# Patient Record
Sex: Female | Born: 1965 | Race: White | Hispanic: No | Marital: Single | Smoking: Former smoker
Health system: Southern US, Community
[De-identification: ages and names within clinical notes are randomized; demographics above are authoritative.]

## PROBLEM LIST (undated history)

## (undated) DIAGNOSIS — Z9889 Other specified postprocedural states: Secondary | ICD-10-CM

## (undated) DIAGNOSIS — B029 Zoster without complications: Secondary | ICD-10-CM

## (undated) DIAGNOSIS — E240 Pituitary-dependent Cushing's disease: Secondary | ICD-10-CM

## (undated) DIAGNOSIS — E119 Type 2 diabetes mellitus without complications: Secondary | ICD-10-CM

## (undated) DIAGNOSIS — K760 Fatty (change of) liver, not elsewhere classified: Secondary | ICD-10-CM

## (undated) DIAGNOSIS — R112 Nausea with vomiting, unspecified: Secondary | ICD-10-CM

## (undated) DIAGNOSIS — I4891 Unspecified atrial fibrillation: Secondary | ICD-10-CM

## (undated) DIAGNOSIS — R519 Headache, unspecified: Secondary | ICD-10-CM

## (undated) DIAGNOSIS — I10 Essential (primary) hypertension: Secondary | ICD-10-CM

## (undated) DIAGNOSIS — R55 Syncope and collapse: Secondary | ICD-10-CM

## (undated) DIAGNOSIS — E0590 Thyrotoxicosis, unspecified without thyrotoxic crisis or storm: Secondary | ICD-10-CM

## (undated) HISTORY — PX: CERVICAL ABLATION: SHX5771

## (undated) HISTORY — DX: Syncope and collapse: R55

## (undated) HISTORY — PX: OTHER SURGICAL HISTORY: SHX169

## (undated) HISTORY — DX: Essential (primary) hypertension: I10

## (undated) HISTORY — DX: Thyrotoxicosis, unspecified without thyrotoxic crisis or storm: E05.90

## (undated) HISTORY — PX: ELBOW SURGERY: SHX618

## (undated) HISTORY — DX: Unspecified atrial fibrillation: I48.91

---

## 1999-06-24 ENCOUNTER — Other Ambulatory Visit: Admission: RE | Admit: 1999-06-24 | Discharge: 1999-06-24 | Payer: Self-pay | Admitting: Family Medicine

## 2002-10-26 ENCOUNTER — Other Ambulatory Visit: Admission: RE | Admit: 2002-10-26 | Discharge: 2002-10-26 | Payer: Self-pay | Admitting: Family Medicine

## 2003-03-20 ENCOUNTER — Other Ambulatory Visit: Admission: RE | Admit: 2003-03-20 | Discharge: 2003-03-20 | Payer: Self-pay | Admitting: Family Medicine

## 2004-04-09 ENCOUNTER — Other Ambulatory Visit: Admission: RE | Admit: 2004-04-09 | Discharge: 2004-04-09 | Payer: Self-pay | Admitting: Family Medicine

## 2004-12-30 ENCOUNTER — Encounter: Admission: RE | Admit: 2004-12-30 | Discharge: 2005-03-30 | Payer: Self-pay | Admitting: Family Medicine

## 2005-04-16 ENCOUNTER — Other Ambulatory Visit: Admission: RE | Admit: 2005-04-16 | Discharge: 2005-04-16 | Payer: Self-pay | Admitting: Family Medicine

## 2005-07-26 ENCOUNTER — Ambulatory Visit (HOSPITAL_COMMUNITY): Admission: RE | Admit: 2005-07-26 | Discharge: 2005-07-26 | Payer: Self-pay | Admitting: Chiropractic Medicine

## 2006-08-05 ENCOUNTER — Other Ambulatory Visit: Admission: RE | Admit: 2006-08-05 | Discharge: 2006-08-05 | Payer: Self-pay | Admitting: Family Medicine

## 2009-04-06 ENCOUNTER — Encounter: Admission: RE | Admit: 2009-04-06 | Discharge: 2009-04-06 | Payer: Self-pay | Admitting: Orthopedic Surgery

## 2011-01-09 ENCOUNTER — Institutional Professional Consult (permissible substitution) (INDEPENDENT_AMBULATORY_CARE_PROVIDER_SITE_OTHER): Payer: BC Managed Care – PPO | Admitting: Cardiovascular Disease

## 2011-01-09 DIAGNOSIS — I4891 Unspecified atrial fibrillation: Secondary | ICD-10-CM

## 2011-01-09 DIAGNOSIS — E039 Hypothyroidism, unspecified: Secondary | ICD-10-CM

## 2011-01-16 ENCOUNTER — Encounter: Payer: Self-pay | Admitting: Cardiovascular Disease

## 2011-01-17 ENCOUNTER — Ambulatory Visit (HOSPITAL_COMMUNITY): Payer: BC Managed Care – PPO | Attending: Cardiovascular Disease

## 2011-01-17 DIAGNOSIS — I10 Essential (primary) hypertension: Secondary | ICD-10-CM | POA: Insufficient documentation

## 2011-01-17 DIAGNOSIS — I059 Rheumatic mitral valve disease, unspecified: Secondary | ICD-10-CM | POA: Insufficient documentation

## 2011-01-17 DIAGNOSIS — I4891 Unspecified atrial fibrillation: Secondary | ICD-10-CM | POA: Insufficient documentation

## 2011-01-17 DIAGNOSIS — E119 Type 2 diabetes mellitus without complications: Secondary | ICD-10-CM | POA: Insufficient documentation

## 2011-01-21 ENCOUNTER — Ambulatory Visit (INDEPENDENT_AMBULATORY_CARE_PROVIDER_SITE_OTHER): Payer: BC Managed Care – PPO | Admitting: Cardiovascular Disease

## 2011-01-21 DIAGNOSIS — I4891 Unspecified atrial fibrillation: Secondary | ICD-10-CM

## 2011-01-21 DIAGNOSIS — R55 Syncope and collapse: Secondary | ICD-10-CM

## 2011-02-19 ENCOUNTER — Other Ambulatory Visit (HOSPITAL_COMMUNITY): Payer: Self-pay | Admitting: Internal Medicine

## 2011-02-19 DIAGNOSIS — E0500 Thyrotoxicosis with diffuse goiter without thyrotoxic crisis or storm: Secondary | ICD-10-CM

## 2011-03-05 ENCOUNTER — Encounter (HOSPITAL_COMMUNITY)
Admission: RE | Admit: 2011-03-05 | Discharge: 2011-03-05 | Disposition: A | Discharge status: Home / Self Care | Payer: BC Managed Care – PPO | Source: Ambulatory Visit | Attending: Internal Medicine | Admitting: Internal Medicine

## 2011-03-05 DIAGNOSIS — E0500 Thyrotoxicosis with diffuse goiter without thyrotoxic crisis or storm: Secondary | ICD-10-CM | POA: Insufficient documentation

## 2011-03-06 ENCOUNTER — Other Ambulatory Visit (HOSPITAL_COMMUNITY): Payer: Self-pay | Admitting: Internal Medicine

## 2011-03-06 ENCOUNTER — Ambulatory Visit (HOSPITAL_COMMUNITY)
Admission: RE | Admit: 2011-03-06 | Discharge: 2011-03-06 | Disposition: A | Discharge status: Home / Self Care | Payer: BC Managed Care – PPO | Source: Ambulatory Visit | Attending: Internal Medicine | Admitting: Internal Medicine

## 2011-03-06 DIAGNOSIS — E0500 Thyrotoxicosis with diffuse goiter without thyrotoxic crisis or storm: Secondary | ICD-10-CM | POA: Insufficient documentation

## 2011-03-06 DIAGNOSIS — E0590 Thyrotoxicosis, unspecified without thyrotoxic crisis or storm: Secondary | ICD-10-CM

## 2011-03-06 MED ORDER — SODIUM IODIDE I 131 CAPSULE
16.0000 | Freq: Once | INTRAVENOUS | Status: AC | PRN
  Administered 2011-03-06: 16 via ORAL

## 2011-03-06 MED ORDER — SODIUM IODIDE I 131 CAPSULE
12.9000 | Freq: Once | INTRAVENOUS | Status: AC | PRN
  Administered 2011-03-06: 12.9 via ORAL

## 2011-03-06 MED ORDER — SODIUM PERTECHNETATE TC 99M INJECTION
10.0000 | Freq: Once | INTRAVENOUS | Status: AC | PRN
  Administered 2011-03-06: 10 via INTRAVENOUS

## 2011-03-18 ENCOUNTER — Encounter: Payer: Self-pay | Admitting: Cardiovascular Disease

## 2011-03-18 DIAGNOSIS — I10 Essential (primary) hypertension: Secondary | ICD-10-CM | POA: Insufficient documentation

## 2011-03-18 DIAGNOSIS — E0590 Thyrotoxicosis, unspecified without thyrotoxic crisis or storm: Secondary | ICD-10-CM | POA: Insufficient documentation

## 2011-03-18 DIAGNOSIS — R55 Syncope and collapse: Secondary | ICD-10-CM | POA: Insufficient documentation

## 2011-03-18 DIAGNOSIS — I4891 Unspecified atrial fibrillation: Secondary | ICD-10-CM | POA: Insufficient documentation

## 2011-03-24 ENCOUNTER — Encounter: Payer: Self-pay | Admitting: Cardiovascular Disease

## 2011-03-24 ENCOUNTER — Ambulatory Visit (INDEPENDENT_AMBULATORY_CARE_PROVIDER_SITE_OTHER): Payer: BC Managed Care – PPO | Admitting: Cardiovascular Disease

## 2011-03-24 VITALS — BP 118/64 | HR 60 | Ht 68.0 in | Wt 187.2 lb

## 2011-03-24 DIAGNOSIS — I10 Essential (primary) hypertension: Secondary | ICD-10-CM

## 2011-03-24 DIAGNOSIS — I4891 Unspecified atrial fibrillation: Secondary | ICD-10-CM

## 2011-03-24 MED ORDER — METOPROLOL TARTRATE 100 MG PO TABS: 50.0000 mg | ORAL_TABLET | Freq: Two times a day (BID) | ORAL | Status: DC

## 2011-03-24 NOTE — Assessment & Plan Note (Signed)
Her BP is well controlled 

## 2011-03-24 NOTE — Assessment & Plan Note (Signed)
Anne Mcintyre is doing well.  She has maintained sinus rhythm since getting RAI.  Will decrease her metoprolol to 50 BID.  Will see her in 3 months.  I anticipate changing her Pradaxa to ASA 325 once she is stable.

## 2011-03-24 NOTE — Progress Notes (Signed)
Anne Mcintyre Date of Birth  03/19/1966 [facility] [address]     [address] [place], [place]  [address] [phone]  Fax  [phone]  History of Present Illness:  Has done well since I last saw her.  Tried Methimazole but developed a rash.  Had RAI 3 weeks ago.  Will start Synthroid at some point in the near future. Heart has stayed in rhythm.  Has stopped smoking.    Current Outpatient Prescriptions on File Prior to Visit  Medication Sig Dispense Refill  . Ascorbic Acid (VITAMIN C) 500 MG tablet Take 500 mg by mouth daily.        . dabigatran (PRADAXA) 150 MG CAPS Take 150 mg by mouth every 12 (twelve) hours.        Marland Kitchen lisinopril-hydrochlorothiazide (PRINZIDE,ZESTORETIC) 10-12.5 MG per tablet Take 1 tablet by mouth daily.        . metoprolol (LOPRESSOR) 100 MG tablet Take 100 mg by mouth 2 (two) times daily.        . Omega-3 Fatty Acids (FISH OIL) 1000 MG CAPS Take by mouth daily.        . verapamil (CALAN) 40 MG tablet Take 40 mg by mouth daily.        Marland Kitchen DISCONTD: Calcium Carbonate-Vitamin D (CALCIUM + D PO) Take 600 mg by mouth.        . DISCONTD: methimazole (TAPAZOLE) 10 MG tablet Take 10 mg by mouth daily. 4 DAILY         Allergies  Allergen Reactions  . Codeine   . Demerol   . Methimazole Hives  . Penicillins     Past Medical History  Diagnosis Date  . Syncope and collapse     PRESYNCOPE  . Hypertension   . Hyperthyroidism     POSSIBLE GRAVE'S DISEASE  . Atrial fibrillation     Past Surgical History  Procedure Date  . Elbow surgery     History  Smoking status  . Former Smoker  . Quit date: 10/10/2010  Smokeless tobacco  . Not on file    History  Alcohol Use No    Family History  Problem Relation Age of Onset  . Hypertension Mother   . Heart attack Father   . Hypertension Brother   . Diabetes Brother     Reviw of Systems:  Reviewed in the HPI.  All other systems are negative.  Physical Exam: BP  118/64  Pulse 60  Ht 5\' 8"  (1.727 m)  Wt 187 lb 3.2 oz (84.913 kg)  BMI 28.46 kg/m2  LMP 03/24/2011 The patient is alert and oriented x 3.  The mood and affect are normal.  The skin is warm and dry.  Color is normal.  The HEENT exam reveals that the sclera are nonicteric.  The mucous membranes are moist.  The carotids are 2+ without bruits.  There is no thyromegaly.  There is no JVD.  The lungs are clear.  The chest wall is non tender.  The heart exam reveals a regular rate with a normal S1 and S2.  There are no murmurs, gallops, or rubs.  The PMI is not displaced.   Abdominal exam reveals good bowel sounds.  There is no guarding or rebound.  There is no hepatosplenomegaly or tenderness.  There are no masses.  Exam of the legs reveal no clubbing, cyanosis, or edema.  The legs are without rashes.  The distal pulses are intact.  Cranial  nerves II - XII are intact.  Motor and sensory functions are intact.  The gait is normal.  ECG:  Assessment / Plan:

## 2011-07-01 ENCOUNTER — Ambulatory Visit (INDEPENDENT_AMBULATORY_CARE_PROVIDER_SITE_OTHER): Payer: BC Managed Care – PPO | Admitting: Cardiovascular Disease

## 2011-07-01 ENCOUNTER — Encounter: Payer: Self-pay | Admitting: Cardiovascular Disease

## 2011-07-01 VITALS — BP 110/70 | HR 60 | Ht 67.0 in | Wt 198.0 lb

## 2011-07-01 DIAGNOSIS — I4891 Unspecified atrial fibrillation: Secondary | ICD-10-CM

## 2011-07-01 DIAGNOSIS — I10 Essential (primary) hypertension: Secondary | ICD-10-CM

## 2011-07-01 MED ORDER — ASPIRIN EC 81 MG PO TBEC: 81.0000 mg | DELAYED_RELEASE_TABLET | Freq: Every day | ORAL | Status: AC

## 2011-07-01 NOTE — Progress Notes (Signed)
Anne Mcintyre Date of Birth  1966/09/02 [facility] [address]     [address] [place], [place]  [address] [phone]  Fax  [phone]  History of Present Illness:  Faas a 3 female with a history of atrial fibrillation and hyperthyroidism.    she's had her thyroid ablated using RAI. She's now on Synthroid. She started feeling quite a bit better.      she's not had any recurrent episodes of atrial fibrillation.                                                                                                                              Current Outpatient Prescriptions  Medication Sig Dispense Refill  . Ascorbic Acid (VITAMIN C) 500 MG tablet Take 500 mg by mouth daily.        Marland Kitchen CALCIUM PO Take by mouth daily.        Marland Kitchen eletriptan (RELPAX) 40 MG tablet One tablet by mouth as needed for migraine headache.  If the headache improves and then returns, dose may be repeated after 2 hours have elapsed since first dose (do not exceed 80 mg per day). may repeat in 2 hours if necessary       . levothyroxine (SYNTHROID, LEVOTHROID) 125 MCG tablet Take 125 mcg by mouth daily.        Marland Kitchen lisinopril-hydrochlorothiazide (PRINZIDE,ZESTORETIC) 10-12.5 MG per tablet Take 1 tablet by mouth daily.        . metoprolol (LOPRESSOR) 100 MG tablet Take 0.5 tablets (50 mg total) by mouth 2 (two) times daily.  60 tablet  12  . Omega-3 Fatty Acids (FISH OIL) 1000 MG CAPS Take by mouth daily.        . verapamil (CALAN) 40 MG tablet Take 40 mg by mouth daily.        Pradaxa 150 mg twice a day   Allergies  Allergen Reactions  . Codeine   . Demerol   . Methimazole Hives  . Penicillins     Past Medical History  Diagnosis Date  . Syncope and collapse     PRESYNCOPE  . Hypertension   . Hyperthyroidism     POSSIBLE GRAVE'S DISEASE  . Atrial fibrillation     Past Surgical History  Procedure Date  . Elbow surgery     History  Smoking status  . Current Some Day  Smoker  . Last Attempt to Quit: 10/10/2010  Smokeless tobacco  . Not on file    History  Alcohol Use No    Family History  Problem Relation Age of Onset  . Hypertension Mother   . Heart attack Father   . Hypertension Brother   . Diabetes Brother     Reviw of Systems:  Reviewed in the HPI.  All other systems are negative.  Physical Exam: BP 110/70  Pulse 60  Ht 5\' 7"  (1.702 m)  Wt 198 lb (89.812 kg)  BMI 31.01 kg/m2 The patient is alert and oriented x 3.  The mood and affect are normal.   Skin: warm and dry.  Color is normal.    HEENT:   the sclera are nonicteric.  The mucous membranes are moist.  The carotids are 2+ without bruits.  There is no thyromegaly.  There is no JVD.    Lungs: clear.  The chest wall is non tender.    Heart: regular rate with a normal S1 and S2.  There are no murmurs, gallops, or rubs. The PMI is not displaced.     Abdomen: good bowel sounds.  There is no guarding or rebound.  There is no hepatosplenomegaly or tenderness.  There are no masses.   Extremities:  no clubbing, cyanosis, or edema.  The legs are without rashes.  The distal pulses are intact.   Neuro:  Cranial nerves II - XII are intact.  Motor and sensory functions are intact.    The gait is normal.  ECG:  Assessment / Plan:

## 2011-07-01 NOTE — Assessment & Plan Note (Addendum)
Her blood pressure is well controlled today. She is to continue with her same medications.

## 2011-07-01 NOTE — Assessment & Plan Note (Signed)
She remains in sinus rhythm. She has been very stable. At this point she no longer has hyperthyroidism I think we can safely stop her Pradaxa We will start her on aspirin 81 mg a day. We'll continue to follow her.I will see her in  6 months.

## 2011-10-01 ENCOUNTER — Ambulatory Visit: Payer: BC Managed Care – PPO | Admitting: Cardiovascular Disease

## 2011-10-28 ENCOUNTER — Ambulatory Visit (INDEPENDENT_AMBULATORY_CARE_PROVIDER_SITE_OTHER): Payer: BC Managed Care – PPO | Admitting: Cardiovascular Disease

## 2011-10-28 ENCOUNTER — Encounter: Payer: Self-pay | Admitting: Cardiovascular Disease

## 2011-10-28 VITALS — BP 119/76 | HR 68 | Ht 67.5 in | Wt 202.8 lb

## 2011-10-28 DIAGNOSIS — R252 Cramp and spasm: Secondary | ICD-10-CM

## 2011-10-28 DIAGNOSIS — I10 Essential (primary) hypertension: Secondary | ICD-10-CM

## 2011-10-28 DIAGNOSIS — I4891 Unspecified atrial fibrillation: Secondary | ICD-10-CM

## 2011-10-28 LAB — BASIC METABOLIC PANEL
CO2: 24 mEq/L (ref 19–32)
Calcium: 9.5 mg/dL (ref 8.4–10.5)
Chloride: 103 mEq/L (ref 96–112)
Creatinine, Ser: 0.9 mg/dL (ref 0.4–1.2)
GFR: 67.75 mL/min (ref >60.00)
Potassium: 3.8 mEq/L (ref 3.5–5.1)

## 2011-10-28 LAB — MAGNESIUM: Magnesium: 2.3 mg/dL (ref 1.5–2.5)

## 2011-10-28 NOTE — Assessment & Plan Note (Addendum)
Anne Mcintyre seems to be doing quite a bit better. She is maintaining sinus rhythm. Unfortunately she has gained some weight since her thyroid ablation.  Her TSH is normal.  I will see her in 6 months for an ECG and BMP.

## 2011-10-28 NOTE — Patient Instructions (Signed)
Your physician wants you to follow-up in: 6 months You will receive a reminder letter in the mail two months in advance. If you don't receive a letter, please call our office to schedule the follow-up appointment. With ekg  Your physician recommends that you return for lab work in: TODAY, magnesium and bmp

## 2011-10-28 NOTE — Assessment & Plan Note (Signed)
She's having lots of leg cramps. We'll check a basic metabolic profile and magnesium level today.

## 2011-10-28 NOTE — Progress Notes (Signed)
Anne Mcintyre Date of Birth  [birth year] [facility] [address]    [address] [place], [place]  [address] [phone]  Fax  [phone]  History of Present Illness:  Anne Mcintyre is a 45 year old female with a history of Graves' disease and hyperthyroidism. She also had transient atrial fibrillation. She has had her thyroid ablated and is now on Synthroid. Her only complaint now so she's having lots of muscle cramps. She has these cramps in her back and abdomen.  She took Pradaxa  for a couple of months while she was having atrial fibrillation.    Current Outpatient Prescriptions on File Prior to Visit  Medication Sig Dispense Refill  . Ascorbic Acid (VITAMIN C) 500 MG tablet Take 500 mg by mouth daily.        Marland Kitchen aspirin EC 81 MG tablet Take 1 tablet (81 mg total) by mouth daily.  150 tablet  2  . CALCIUM PO Take by mouth daily.        Marland Kitchen eletriptan (RELPAX) 40 MG tablet One tablet by mouth as needed for migraine headache.  If the headache improves and then returns, dose may be repeated after 2 hours have elapsed since first dose (do not exceed 80 mg per day). may repeat in 2 hours if necessary       . levothyroxine (SYNTHROID, LEVOTHROID) 125 MCG tablet Take 125 mcg by mouth daily.        Marland Kitchen lisinopril-hydrochlorothiazide (PRINZIDE,ZESTORETIC) 10-12.5 MG per tablet Take 1 tablet by mouth daily.        . metoprolol (LOPRESSOR) 100 MG tablet Take 0.5 tablets (50 mg total) by mouth 2 (two) times daily.  60 tablet  12  . Omega-3 Fatty Acids (FISH OIL) 1000 MG CAPS Take by mouth daily.        . verapamil (CALAN) 40 MG tablet Take 40 mg by mouth daily.          Allergies  Allergen Reactions  . Codeine   . Demerol   . Methimazole Hives  . Penicillins     Past Medical History  Diagnosis Date  . Syncope and collapse     PRESYNCOPE  . Hypertension   . Hyperthyroidism     POSSIBLE GRAVE'S DISEASE  . Atrial fibrillation     Past Surgical History  Procedure Date  . Elbow surgery       History  Smoking status  . Current Some Day Smoker  . Last Attempt to Quit: 10/10/2010  Smokeless tobacco  . Not on file    History  Alcohol Use No    Family History  Problem Relation Age of Onset  . Hypertension Mother   . Heart attack Father   . Hypertension Brother   . Diabetes Brother     Reviw of Systems:  Reviewed in the HPI.  All other systems are negative.  Physical Exam: BP 119/76  Pulse 68  Ht 5' 7.5" (1.715 m)  Wt 202 lb 12.8 oz (91.989 kg)  BMI 31.29 kg/m2 The patient is alert and oriented x 3.  The mood and affect are normal.   Skin: warm and dry.  Color is normal.    HEENT:   Normocephalic/atraumatic. There is no JVD. Chest normal carotids.  Lungs: Lungs clear to auscultation.   Heart: Regular rate S1-S2. She has no murmurs gallops or rubs.    Abdomen: Her abdomen is benign. Chest good bowel sounds.  Extremities:  Extremities she has no clubbing cyanosis or  edema.  Neuro:  Nonfocal. Her gait is normal.    ECG:   Assessment / Plan:

## 2012-12-16 IMAGING — NM NM RAI THERAPY FOR HYPERTHYROIDISM
1 series · 1 of 1 positions shown · non-contrast
Comparison: Thyroid uptake and scan 03/06/2011

CLINICAL DATA: Hyperthyroidism

NUCLEAR MEDICINE RADIOACTIVE IODINE THERAPY FOR HYPERTHYROIDISM
TECHNIQUE: The risks and benefits of radioactive iodine therapy
were discussed with the patient by Dr. Abrhet. Alternative therapies
were also mentioned. Radiation safety was discussed with the
patient, including how to protect the general public from exposure.
There were no barriers to communication.  Written consent was
obtained.  The patient then received a capsule containing the
radiopharmaceutical.  The patient will follow-up with the referring
physician.
Radiopharmaceutical: 12.9 mCi 131 sodium iodide orally

[st static image · 1.72mm/px · 1 of 1 slices shown]
[im 1/1]
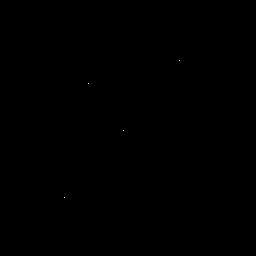

[1 of 1 positions shown; findings below may reference images not displayed]

FINDINGS: As above
IMPRESSION: Radioactive iodine therapy for Graves' disease utilizing 12.9 mCi
of 131 sodium iodide orally.

## 2021-03-14 ENCOUNTER — Other Ambulatory Visit: Payer: Self-pay | Admitting: Internal Medicine

## 2021-03-14 DIAGNOSIS — D3502 Benign neoplasm of left adrenal gland: Secondary | ICD-10-CM

## 2021-03-14 DIAGNOSIS — E249 Cushing's syndrome, unspecified: Secondary | ICD-10-CM

## 2021-03-28 ENCOUNTER — Other Ambulatory Visit: Payer: Self-pay

## 2021-04-16 ENCOUNTER — Other Ambulatory Visit: Payer: 59

## 2021-04-18 ENCOUNTER — Ambulatory Visit
Admission: RE | Admit: 2021-04-18 | Discharge: 2021-04-18 | Disposition: A | Discharge status: Home / Self Care | Payer: 59 | Source: Ambulatory Visit | Attending: Internal Medicine | Admitting: Internal Medicine

## 2021-04-18 DIAGNOSIS — D3502 Benign neoplasm of left adrenal gland: Secondary | ICD-10-CM

## 2021-04-18 DIAGNOSIS — E249 Cushing's syndrome, unspecified: Secondary | ICD-10-CM

## 2022-12-18 ENCOUNTER — Other Ambulatory Visit: Payer: Self-pay | Admitting: Urology

## 2023-01-10 IMAGING — CT CT ABDOMEN W/O CM
1 of 2 series · 13 of 32 positions shown, 19 images · non-contrast
Comparison: No prior examination is available for direct
comparison. Report from prior examination 07/10/2016 was reviewed.

CLINICAL DATA: Adrenal adenoma, Cushing's syndrome

EXAM:
CT ABDOMEN WITHOUT CONTRAST
TECHNIQUE: Multidetector CT imaging of the abdomen was performed following the
standard protocol without IV contrast.

[Series 2: abd w/(date) · axial · 0.92mm/px · z∈[-285,-70]mm · 13 of 51 slices shown, 19 images]
[im 4/51  soft-tissue]
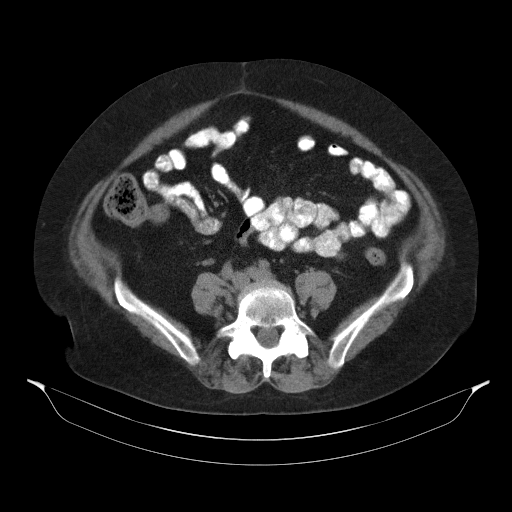
[im 4/51  bone]
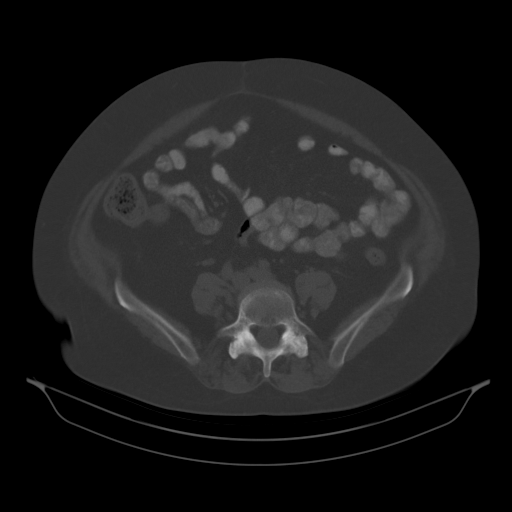
[im 7/51  soft-tissue]
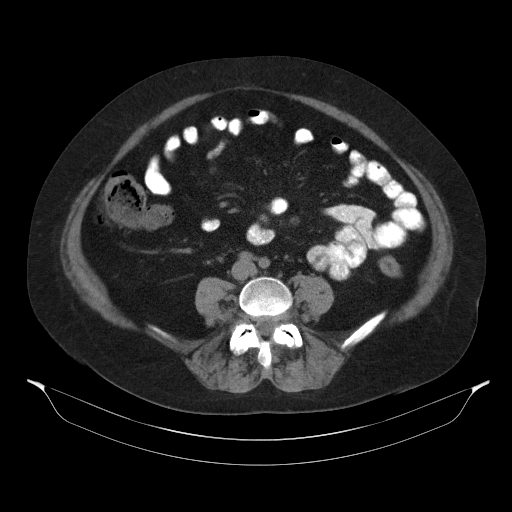
[im 11/51  soft-tissue]
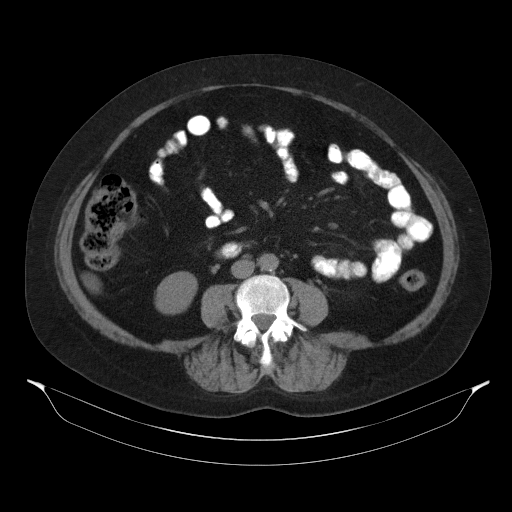
[im 14/51  soft-tissue]
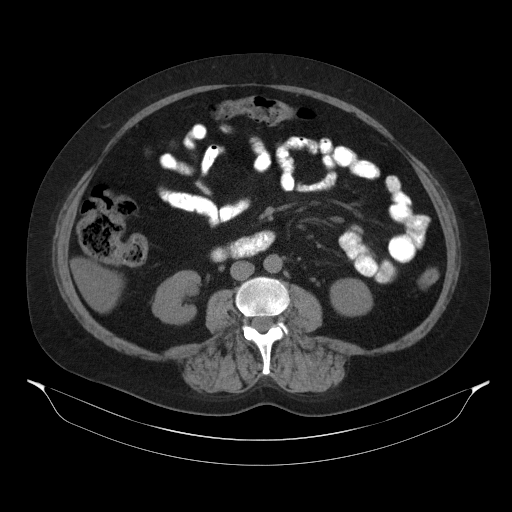
[im 17/51  soft-tissue]
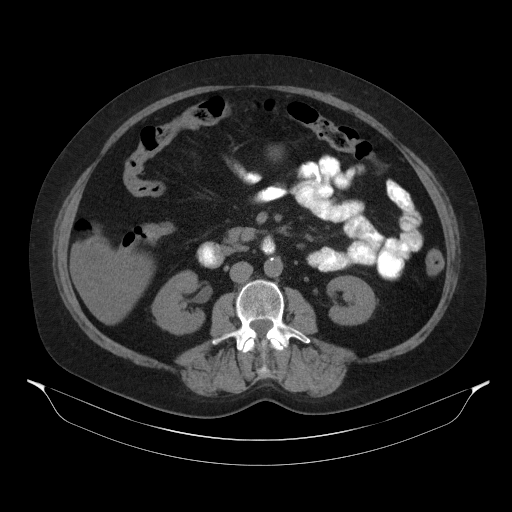
[im 21/51  soft-tissue]
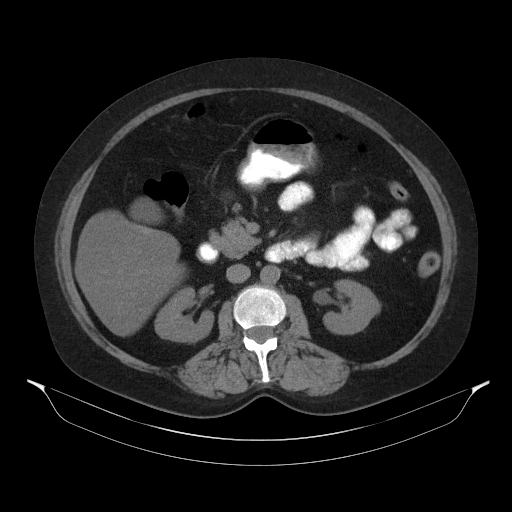
[im 27/51  soft-tissue]
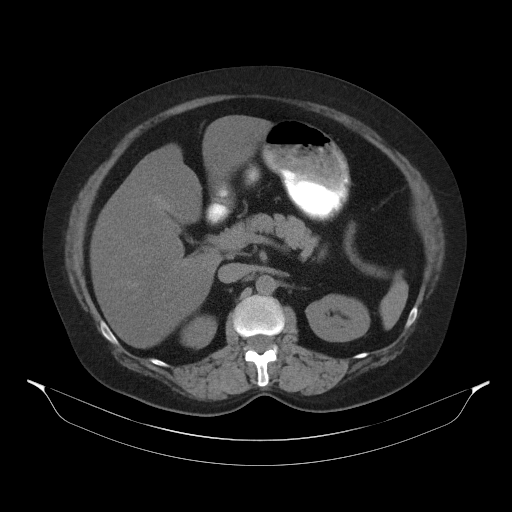
[im 31/51  soft-tissue]
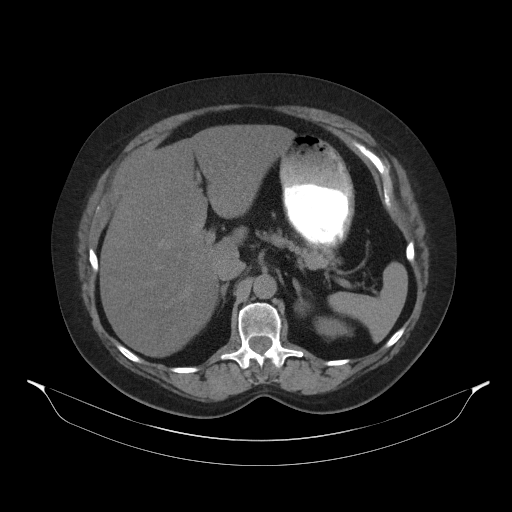
[im 34/51  soft-tissue]
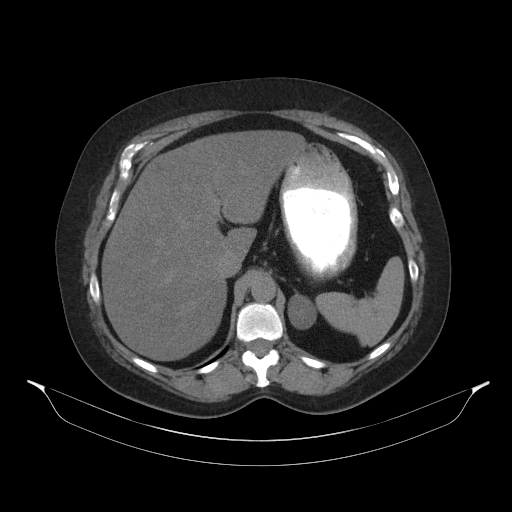
[im 34/51  bone]
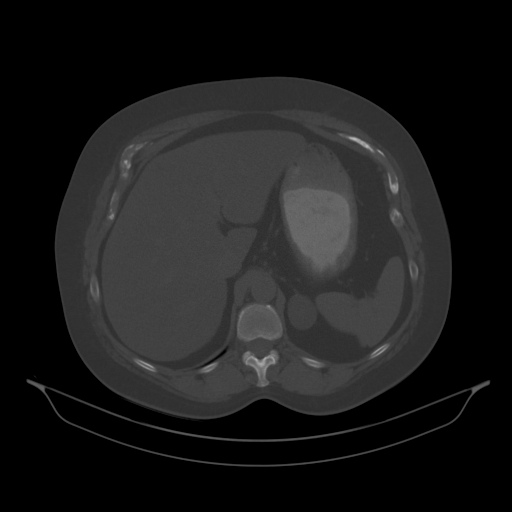
[im 37/51  soft-tissue]
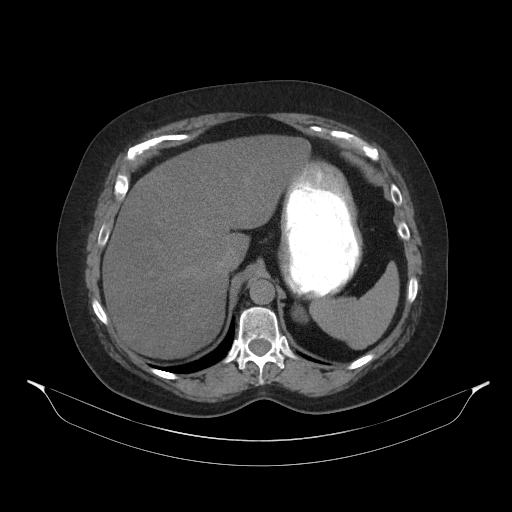
[im 37/51  lung]
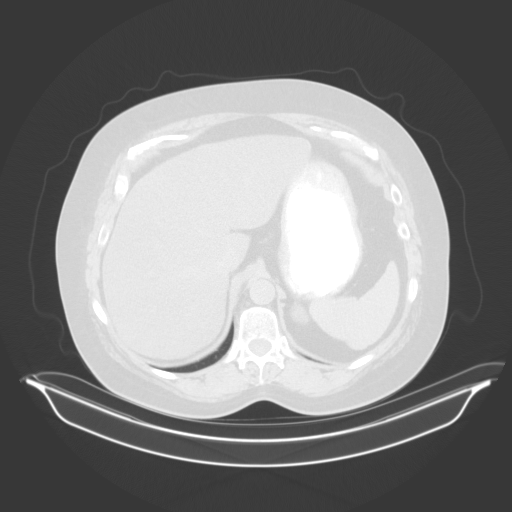
[im 41/51  soft-tissue]
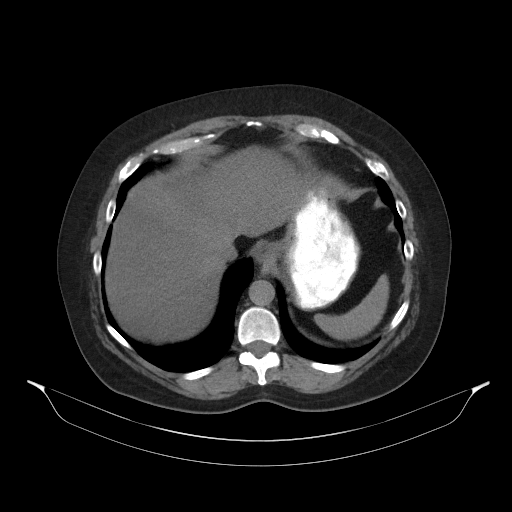
[im 41/51  lung]
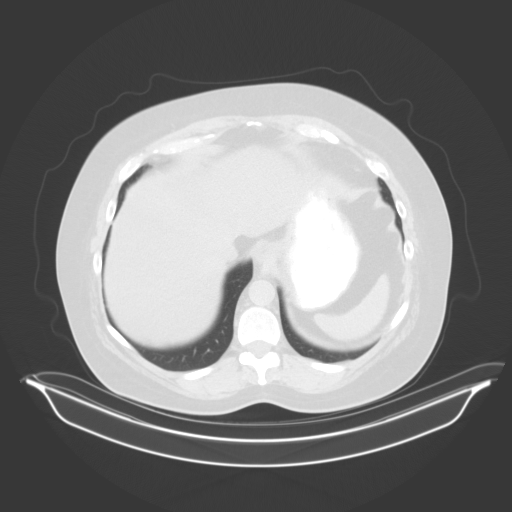
[im 44/51  soft-tissue]
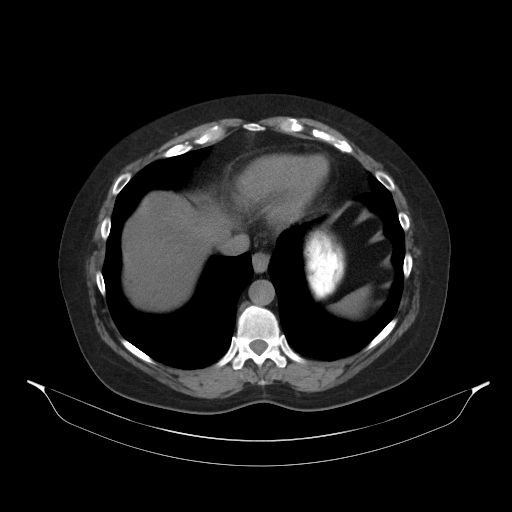
[im 44/51  lung]
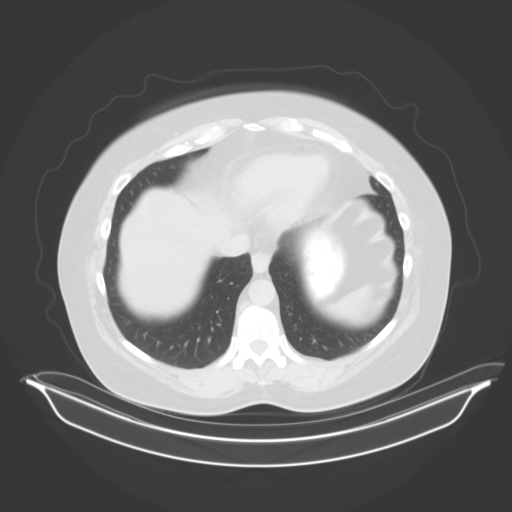
[im 47/51  soft-tissue]
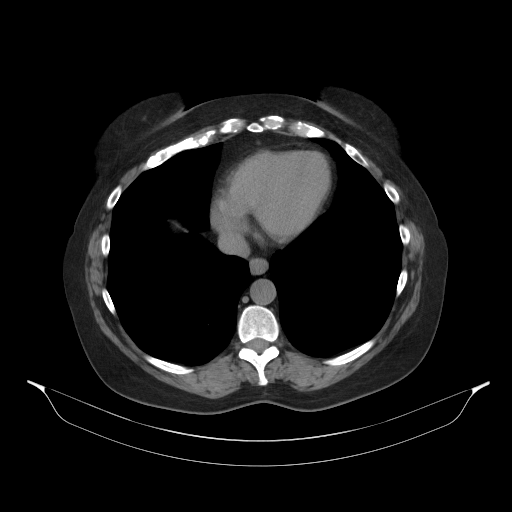
[im 47/51  lung]
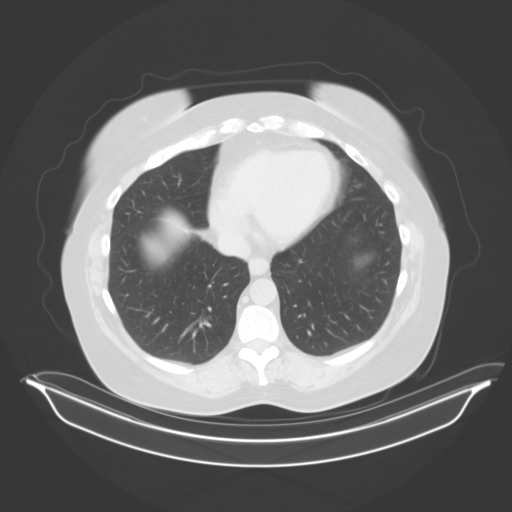

[13 of 32 positions shown; findings below may reference images not displayed]

FINDINGS: Lower chest: The visualized lung bases are clear bilaterally. The
visualized heart and pericardium are unremarkable.

Hepatobiliary: Moderate hepatic steatosis with areas of fatty
sparing within segment 4A and within the gallbladder fossa. No
definite focal intrahepatic mass identified on this noncontrast
examination. No intra or extrahepatic biliary ductal dilation.
Gallbladder unremarkable.

Pancreas: Unremarkable

Spleen: Unremarkable

Adrenals/Urinary Tract: 27 mm x 32 mm left adrenal adenoma is
unchanged, by report, from prior examination. Right adrenal gland is
unremarkable. The kidneys are normal in size and position. No
intrarenal calcifications. No hydronephrosis. No perinephric fluid
collections are identified.

Stomach/Bowel: The stomach and visualized small and large bowel are
unremarkable. Appendix normal. No free intraperitoneal gas. No free
fluid within the visualized upper abdomen.

Vascular/Lymphatic: No pathologic adenopathy. Mild atherosclerotic
calcification within the abdominal aorta.

Other: Tiny fat containing umbilical hernia.

Musculoskeletal: Degenerative changes are seen at the lumbosacral
junction. No lytic or blastic bone lesions are seen.
IMPRESSION: 32 mm left adrenal adenoma, stable by report from prior examination.

Moderate hepatic steatosis.

Aortic Atherosclerosis ([id number].A).

## 2023-02-17 NOTE — Progress Notes (Signed)
Anesthesia Review:  PCP: Cardiologist : Chest x-ray : EKG : Echo : Stress test: Cardiac Cath :  Activity level:  Sleep Study/ CPAP : Fasting Blood Sugar :      / Checks Blood Sugar -- times a day:   Blood Thinner/ Instructions /Last Dose: ASA / Instructions/ Last Dose :  

## 2023-02-17 NOTE — Patient Instructions (Signed)
SURGICAL WAITING ROOM VISITATION  Patients having surgery or a procedure may have no more than 2 support people in the waiting area - these visitors may rotate.    Children under the age of 67 must have an adult with them who is not the patient.  Due to an increase in RSV and influenza rates and associated hospitalizations, children ages 48 and under may not visit patients in [facility] hospitals.  If the patient needs to stay at the hospital during part of their recovery, the visitor guidelines for inpatient rooms apply. Pre-op nurse will coordinate an appropriate time for 1 support person to accompany patient in pre-op.  This support person may not rotate.    Please refer to the [facility] website for the visitor guidelines for Inpatients (after your surgery is over and you are in a regular room).       Your procedure is scheduled on:  02/23/23   Report to [facility] Main Entrance    Report to admitting at  0515   Call this number if you have problems the morning of surgery [phone]   Do not eat food  or drink liquids :After Midnight.              Clear lqiuid diet the day before surgery.            Magnesium citrate 8 ounces at 12 noon day before surgery                  If you have questions, please contact your surgeon's office.   FOLLOW BOWEL PREP AND ANY ADDITIONAL PRE OP INSTRUCTIONS YOU RECEIVED FROM YOUR SURGEON'S OFFICE!!!     Oral Hygiene is also important to reduce your risk of infection.                                    Remember - BRUSH YOUR TEETH THE MORNING OF SURGERY WITH YOUR REGULAR TOOTHPASTE  DENTURES WILL BE REMOVED PRIOR TO SURGERY PLEASE DO NOT APPLY "Poly grip" OR ADHESIVES!!!   Do NOT smoke after Midnight   Take these medicines the morning of surgery with A SIP OF WATER:  synthroid, metoprolol, verapamil   DO NOT TAKE ANY ORAL DIABETIC MEDICATIONS DAY OF YOUR SURGERY  Bring CPAP mask and tubing day of surgery.                               You may not have any metal on your body including hair pins, jewelry, and body piercing             Do not wear make-up, lotions, powders, perfumes/cologne, or deodorant  Do not wear nail polish including gel and S&S, artificial/acrylic nails, or any other type of covering on natural nails including finger and toenails. If you have artificial nails, gel coating, etc. that needs to be removed by a nail salon please have this removed prior to surgery or surgery may need to be canceled/ delayed if the surgeon/ anesthesia feels like they are unable to be safely monitored.   Do not shave  48 hours prior to surgery.               Men may shave face and neck.   Do not bring valuables to the hospital. [facility] IS NOT  RESPONSIBLE   FOR VALUABLES.   Contacts, glasses, dentures or bridgework may not be worn into surgery.   Bring small overnight bag day of surgery.   DO NOT [place]. PHARMACY WILL DISPENSE MEDICATIONS LISTED ON YOUR MEDICATION LIST TO YOU DURING YOUR ADMISSION [place]!    Patients discharged on the day of surgery will not be allowed to drive home.  Someone NEEDS to stay with you for the first 24 hours after anesthesia.   Special Instructions: Bring a copy of your healthcare power of attorney and living will documents the day of surgery if you haven't scanned them before.              Please read over the following fact sheets you were given: IF [place] [phone]   If you received a COVID test during your pre-op visit  it is requested that you wear a mask when out in public, stay away from anyone that may not be feeling well and notify your surgeon if you develop symptoms. If you test positive for Covid or have been in contact with anyone that has tested positive in the last 10 days please notify you surgeon.    [facility] - Preparing for Surgery Before  surgery, you can play an important role.  Because skin is not sterile, your skin needs to be as free of germs as possible.  You can reduce the number of germs on your skin by washing with CHG (chlorahexidine gluconate) soap before surgery.  CHG is an antiseptic cleaner which kills germs and bonds with the skin to continue killing germs even after washing. Please DO NOT use if you have an allergy to CHG or antibacterial soaps.  If your skin becomes reddened/irritated stop using the CHG and inform your nurse when you arrive at Short Stay. Do not shave (including legs and underarms) for at least 48 hours prior to the first CHG shower.  You may shave your face/neck. Please follow these instructions carefully:  1.  Shower with CHG Soap the night before surgery and the  morning of Surgery.  2.  If you choose to wash your hair, wash your hair first as usual with your  normal  shampoo.  3.  After you shampoo, rinse your hair and body thoroughly to remove the  shampoo.                           4.  Use CHG as you would any other liquid soap.  You can apply chg directly  to the skin and wash                       Gently with a scrungie or clean washcloth.  5.  Apply the CHG Soap to your body ONLY FROM THE NECK DOWN.   Do not use on face/ open                           Wound or open sores. Avoid contact with eyes, ears mouth and genitals (private parts).                       Wash face,  Genitals (private parts) with your normal soap.             6.  Wash thoroughly,  paying special attention to the area where your surgery  will be performed.  7.  Thoroughly rinse your body with warm water from the neck down.  8.  DO NOT shower/wash with your normal soap after using and rinsing off  the CHG Soap.                9.  Pat yourself dry with a clean towel.            10.  Wear clean pajamas.            11.  Place clean sheets on your bed the night of your first shower and do not  sleep with pets. Day of Surgery  : Do not apply any lotions/deodorants the morning of surgery.  Please wear clean clothes to the hospital/surgery center.  FAILURE TO FOLLOW THESE INSTRUCTIONS MAY RESULT IN THE CANCELLATION OF YOUR SURGERY PATIENT SIGNATURE_________________________________  NURSE SIGNATURE__________________________________  ________________________________________________________________________

## 2023-02-18 ENCOUNTER — Encounter (HOSPITAL_COMMUNITY): Payer: Self-pay

## 2023-02-18 ENCOUNTER — Other Ambulatory Visit: Payer: Self-pay

## 2023-02-18 ENCOUNTER — Encounter (HOSPITAL_COMMUNITY)
Admission: RE | Admit: 2023-02-18 | Discharge: 2023-02-18 | Disposition: A | Discharge status: Home / Self Care | Payer: 59 | Source: Ambulatory Visit | Attending: Urology | Admitting: Urology

## 2023-02-18 VITALS — BP 130/76 | HR 69 | Temp 98.6°F | Resp 16 | Ht 68.0 in | Wt 215.0 lb

## 2023-02-18 DIAGNOSIS — E279 Disorder of adrenal gland, unspecified: Secondary | ICD-10-CM | POA: Diagnosis not present

## 2023-02-18 DIAGNOSIS — I480 Paroxysmal atrial fibrillation: Secondary | ICD-10-CM | POA: Insufficient documentation

## 2023-02-18 DIAGNOSIS — E0590 Thyrotoxicosis, unspecified without thyrotoxic crisis or storm: Secondary | ICD-10-CM | POA: Diagnosis not present

## 2023-02-18 DIAGNOSIS — Z01818 Encounter for other preprocedural examination: Secondary | ICD-10-CM | POA: Diagnosis present

## 2023-02-18 DIAGNOSIS — I10 Essential (primary) hypertension: Secondary | ICD-10-CM | POA: Diagnosis not present

## 2023-02-18 DIAGNOSIS — Z87891 Personal history of nicotine dependence: Secondary | ICD-10-CM | POA: Diagnosis not present

## 2023-02-18 DIAGNOSIS — I447 Left bundle-branch block, unspecified: Secondary | ICD-10-CM | POA: Insufficient documentation

## 2023-02-18 DIAGNOSIS — E119 Type 2 diabetes mellitus without complications: Secondary | ICD-10-CM | POA: Insufficient documentation

## 2023-02-18 HISTORY — DX: Type 2 diabetes mellitus without complications: E11.9

## 2023-02-18 HISTORY — DX: Nausea with vomiting, unspecified: Z98.890

## 2023-02-18 HISTORY — DX: Other specified postprocedural states: R11.2

## 2023-02-18 HISTORY — DX: Pituitary-dependent Cushing's disease: E24.0

## 2023-02-18 HISTORY — DX: Headache, unspecified: R51.9

## 2023-02-18 HISTORY — DX: Zoster without complications: B02.9

## 2023-02-18 LAB — CBC
HCT: 46.1 % — ABNORMAL HIGH (ref 36.0–46.0)
Hemoglobin: 15.6 g/dL — ABNORMAL HIGH (ref 12.0–15.0)
MCH: 31.7 pg (ref 26.0–34.0)
MCHC: 33.8 g/dL (ref 30.0–36.0)
MCV: 93.7 fL (ref 80.0–100.0)
Platelets: 245 10*3/uL (ref 150–400)
RBC: 4.92 MIL/uL (ref 3.87–5.11)
RDW: 13.5 % (ref 11.5–15.5)
WBC: 7 10*3/uL (ref 4.0–10.5)
nRBC: 0 % (ref 0.0–0.2)

## 2023-02-18 LAB — COMPREHENSIVE METABOLIC PANEL
ALT: 65 U/L — ABNORMAL HIGH (ref 0–44)
AST: 56 U/L — ABNORMAL HIGH (ref 15–41)
Albumin: 4.5 g/dL (ref 3.5–5.0)
Alkaline Phosphatase: 80 U/L (ref 38–126)
Anion gap: 11 (ref 5–15)
BUN: 20 mg/dL (ref 6–20)
CO2: 23 mmol/L (ref 22–32)
Calcium: 9.7 mg/dL (ref 8.9–10.3)
Chloride: 102 mmol/L (ref 98–111)
Creatinine, Ser: 1.09 mg/dL — ABNORMAL HIGH (ref 0.44–1.00)
GFR, Estimated: 59 mL/min — ABNORMAL LOW (ref >60)
Glucose, Bld: 201 mg/dL — ABNORMAL HIGH (ref 70–99)
Potassium: 4 mmol/L (ref 3.5–5.1)
Sodium: 136 mmol/L (ref 135–145)
Total Bilirubin: 0.6 mg/dL (ref 0.3–1.2)
Total Protein: 8 g/dL (ref 6.5–8.1)

## 2023-02-18 LAB — GLUCOSE, CAPILLARY: Glucose-Capillary: 187 mg/dL — ABNORMAL HIGH (ref 70–99)

## 2023-02-18 LAB — TYPE AND SCREEN

## 2023-02-18 LAB — HEMOGLOBIN A1C
Hgb A1c MFr Bld: 8.9 % — ABNORMAL HIGH (ref 4.8–5.6)
Mean Plasma Glucose: 208.73 mg/dL

## 2023-02-19 ENCOUNTER — Encounter (HOSPITAL_COMMUNITY): Payer: Self-pay | Admitting: Physician Assistant

## 2023-02-19 ENCOUNTER — Telehealth: Payer: Self-pay | Admitting: Cardiology

## 2023-02-19 NOTE — Telephone Encounter (Signed)
Patient is scheduled with Dr. Royann Shivers on 02/23/2023. Appointment notes reflect pre-op clearance. Will remove request from pre-op pool.   Etta Grandchild. Vangie Henthorn, DNP, NP-C  02/19/2023, 5:05 PM [facility] [address] Office [phone] Fax [phone]

## 2023-02-19 NOTE — Telephone Encounter (Signed)
   Pre-operative Risk Assessment    Patient Name: Anne Mcintyre  DOB: 1966/04/03 MRN: [id number]      Request for Surgical Clearance    Procedure:   Left robotic assisted laproscopic adrenalectomy  Date of Surgery:  Clearance TBD                                 Surgeon:  Dr. 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  Patient has appointment on 4/15.   Signed, Annetta Maw   02/19/2023, 3:26 PM

## 2023-02-19 NOTE — Progress Notes (Signed)
Anesthesia Chart Review   Case: [id number] Date/Time: 02/23/23 0700   Procedure: XI ROBOTIC LEFT LAPAROSOCPIC ADRENALECTOMY (Left)   Anesthesia type: General   Pre-op diagnosis: LEFT ADRENAL MASS   Location: [facility] 03 / [facility]   Surgeons: Heloise Purpura, MD       DISCUSSION:56 y.o. former smoker with h/o PONV, HTN, Graves' disease, hyperthyroidism, paroxysmal atrial fibrillation, DM II, left adrenal mass scheduled for above procedure 02/23/2023 with Dr. Heloise Purpura.   Transient atrial fib, improved following thyroid ablation.  Last seen by cardio 10/28/2011.   LBBB on EKG at PAT visit, this appears to be new.  Will request cardiac eval prior to surgery.  VS: BP 130/76   Pulse 69   Temp 37 C (Oral)   Resp 16   Ht 5\' 8"  (1.727 m)   Wt 97.5 kg   SpO2 95%   BMI 32.69 kg/m   PROVIDERS: [facility]: Labs reviewed: Acceptable for surgery. and forwarded to PCP and Surgeon (all labs ordered are listed, but only abnormal results are displayed)  Labs Reviewed  CBC - Abnormal; Notable for the following components:      Result Value   Hemoglobin 15.6 (*)    HCT 46.1 (*)    All other components within normal limits  COMPREHENSIVE METABOLIC PANEL - Abnormal; Notable for the following components:   Glucose, Bld 201 (*)    Creatinine, Ser 1.09 (*)    AST 56 (*)    ALT 65 (*)    GFR, Estimated 59 (*)    All other components within normal limits  GLUCOSE, CAPILLARY - Abnormal; Notable for the following components:   Glucose-Capillary 187 (*)    All other components within normal limits  HEMOGLOBIN A1C - Abnormal; Notable for the following components:   Hgb A1c MFr Bld 8.9 (*)    All other components within normal limits  TYPE AND SCREEN     IMAGES:   EKG:   CV:  Past Medical History:  Diagnosis Date   Atrial fibrillation    Cushing's disease    Diabetes mellitus without complication    Headache    Hypertension    Hyperthyroidism     POSSIBLE GRAVE'S DISEASE   PONV (postoperative nausea and vomiting)    Shingles    hx of 01/18/23   Syncope and collapse    PRESYNCOPE    Past Surgical History:  Procedure Laterality Date   CESAREAN SECTION     x 2 v   ELBOW SURGERY     tonsilllectomy       MEDICATIONS:  linaclotide (LINZESS) 72 MCG capsule   Ascorbic Acid (VITAMIN C) 1000 MG tablet   aspirin EC 81 MG tablet   atorvastatin (LIPITOR) 10 MG tablet   budesonide (RHINOCORT ALLERGY) 32 MCG/ACT nasal spray   Calcium Carbonate-Vit D-Min (CALCIUM 600 + MINERALS) 600-200 MG-UNIT TABS   dapagliflozin propanediol (FARXIGA) 10 MG TABS tablet   diphenhydrAMINE (BENADRYL) 25 MG tablet   Dulaglutide (TRULICITY) 4.5 MG/0.5ML SOPN   eletriptan (RELPAX) 40 MG tablet   hydrocortisone (CORTEF) 10 MG tablet   ibuprofen (ADVIL) 200 MG tablet   levothyroxine (SYNTHROID, LEVOTHROID) 125 MCG tablet   lisinopril-hydrochlorothiazide (PRINZIDE,ZESTORETIC) 10-12.5 MG per tablet   loratadine (CLARITIN) 10 MG tablet   LORazepam (ATIVAN) 0.5 MG tablet   metoprolol tartrate (LOPRESSOR) 50 MG tablet   Multiple Vitamins-Minerals (ZINC PO)   No current facility-administered medications for this encounter.  Jodell Cipro Ward, PA-C [facility] Pre-Surgical Testing [phone]

## 2023-02-23 ENCOUNTER — Encounter (HOSPITAL_COMMUNITY): Admission: RE | Payer: Self-pay | Source: Ambulatory Visit

## 2023-02-23 ENCOUNTER — Encounter: Payer: Self-pay | Admitting: Cardiovascular Disease

## 2023-02-23 ENCOUNTER — Ambulatory Visit: Payer: 59 | Attending: Cardiovascular Disease | Admitting: Cardiovascular Disease

## 2023-02-23 ENCOUNTER — Inpatient Hospital Stay (HOSPITAL_COMMUNITY): Admission: RE | Admit: 2023-02-23 | Payer: 59 | Source: Ambulatory Visit | Admitting: Urology

## 2023-02-23 VITALS — BP 130/56 | HR 71 | Ht 68.0 in | Wt 220.4 lb

## 2023-02-23 DIAGNOSIS — E0865 Diabetes mellitus due to underlying condition with hyperglycemia: Secondary | ICD-10-CM

## 2023-02-23 DIAGNOSIS — I447 Left bundle-branch block, unspecified: Secondary | ICD-10-CM

## 2023-02-23 DIAGNOSIS — Z8679 Personal history of other diseases of the circulatory system: Secondary | ICD-10-CM

## 2023-02-23 DIAGNOSIS — E249 Cushing's syndrome, unspecified: Secondary | ICD-10-CM

## 2023-02-23 DIAGNOSIS — I4891 Unspecified atrial fibrillation: Secondary | ICD-10-CM

## 2023-02-23 DIAGNOSIS — I700 Atherosclerosis of aorta: Secondary | ICD-10-CM

## 2023-02-23 DIAGNOSIS — I10 Essential (primary) hypertension: Secondary | ICD-10-CM | POA: Diagnosis not present

## 2023-02-23 DIAGNOSIS — E278 Other specified disorders of adrenal gland: Secondary | ICD-10-CM

## 2023-02-23 DIAGNOSIS — E890 Postprocedural hypothyroidism: Secondary | ICD-10-CM

## 2023-02-23 LAB — TYPE AND SCREEN
ABO/RH(D): A NEG
Antibody Screen: NEGATIVE

## 2023-02-23 SURGERY — ADRENALECTOMY, ROBOT-ASSISTED
Anesthesia: General | Laterality: Left

## 2023-02-23 NOTE — Patient Instructions (Signed)
Medication Instructions:  No changes *If you need a refill on your cardiac medications before your next appointment, please call your pharmacy*   Testing/Procedures: Your physician has requested that you have an echocardiogram. Echocardiography is a painless test that uses sound waves to create images of your heart. It provides your doctor with information about the size and shape of your heart and how well your heart's chambers and valves are working. This procedure takes approximately one hour. There are no restrictions for this procedure. Please do NOT wear cologne, perfume, aftershave, or lotions (deodorant is allowed). Please arrive 15 minutes prior to your appointment time.    Follow-Up: At [facility], you and your health needs are our priority.  As part of our continuing mission to provide you with exceptional heart care, we have created designated Provider Care Teams.  These Care Teams include your primary Cardiologist (physician) and Advanced Practice Providers (APPs -  Physician Assistants and Nurse Practitioners) who all work together to provide you with the care you need, when you need it.  We recommend signing up for the patient portal called "MyChart".  Sign up information is provided on this After Visit Summary.  MyChart is used to connect with patients for Virtual Visits (Telemedicine).  Patients are able to view lab/test results, encounter notes, upcoming appointments, etc.  Non-urgent messages can be sent to your provider as well.   To learn more about what you can do with MyChart, go to [email or web address].    Your next appointment:    Follow up as needed  Provider:   Dr Croitoru  

## 2023-02-23 NOTE — Progress Notes (Unsigned)
Cardiology Office Note:    Date:  02/24/2023   ID:  Anne Mcintyre, DOB 27-Feb-1966, MRN [id number]  PCP:  [facility] Providers Cardiologist:  None     Referring MD: [facility]*   No chief complaint on file. Anne Mcintyre is a 57 y.o. female who is being seen today for the evaluation of LBBB and preop CV risk evaluation at the request of [facility]*.   History of Present Illness:    Anne Mcintyre is a 57 y.o. female with a hx of Cushing's disease who was preparing to undergo adrenalectomy when her preop electrocardiogram showed a newly diagnosed left bundle branch block.  She has history of paroxysmal atrial fibrillation that occurred exclusively in the setting of decompensated hyperthyroidism/Graves' disease in 2012.  After correction of the thyroid problem with radioactive iodine ablation she has not had any recurrent arrhythmia.  She does have a history of treated hypertension.  She was recently diagnosed with Cushing syndrome which has led to numerous complications including full-blown diabetes mellitus with poor glycemic control and a recent episode of shingles.  She has a left adrenal adenoma and is hopeful that adrenalectomy will lead to resolution of Cushing's disease.  Her endocrinologist is Dr. Debara Pickett.  Surgery was to be performed by Dr. Laverle Patter.  The patient specifically denies any chest pain at rest or with exertion, dyspnea at rest or with exertion, orthopnea, paroxysmal nocturnal dyspnea, syncope, palpitations, focal neurological deficits, intermittent claudication, lower extremity edema, unexplained weight gain, cough, hemoptysis or wheezing.   Prior to the development of Cushing syndrome she had well-controlled hypertension but did not have diabetes mellitus.  Her lipid profile is not bad with an HDL of 49, LDL 91, triglycerides 125.  She has normal kidney function.  She is clinically euthyroid but has  normal TSH on the current dose of levothyroxine.  Remote history of smoking but quit 7 years ago and only smoked for total roughly 2 pack years.  There is no family history of early onset CAD or PAD.  CT of the abdomen does describe "mild atherosclerotic calcification within the abdominal aorta".  She had endometrial ablation in 2014.  Past Medical History:  Diagnosis Date   Atrial fibrillation    Cushing's disease    Diabetes mellitus without complication    Headache    Hypertension    Hyperthyroidism    POSSIBLE GRAVE'S DISEASE   PONV (postoperative nausea and vomiting)    Shingles    hx of 01/18/23   Syncope and collapse    PRESYNCOPE    Past Surgical History:  Procedure Laterality Date   CESAREAN SECTION     x 2 v   ELBOW SURGERY     tonsilllectomy       Current Medications: Current Meds  Medication Sig   Ascorbic Acid (VITAMIN C) 1000 MG tablet Take 1,000 mg by mouth daily.   aspirin EC 81 MG tablet Take 81 mg by mouth daily. Swallow whole.   atorvastatin (LIPITOR) 10 MG tablet Take 10 mg by mouth daily.   Calcium Carbonate-Vit D-Min (CALCIUM 600 + MINERALS) 600-200 MG-UNIT TABS Take 1 tablet by mouth daily.   dapagliflozin propanediol (FARXIGA) 10 MG TABS tablet Take 10 mg by mouth daily.   Dulaglutide (TRULICITY) 4.5 MG/0.5ML SOPN Inject 4.5 mg into the skin every Monday.   ibuprofen (ADVIL) 200 MG tablet Take 400 mg by mouth every  6 (six) hours as needed for moderate pain.   levothyroxine (SYNTHROID, LEVOTHROID) 125 MCG tablet Take 125 mcg by mouth daily.     linaclotide (LINZESS) 72 MCG capsule Take 72 mcg by mouth daily before breakfast.   lisinopril-hydrochlorothiazide (PRINZIDE,ZESTORETIC) 10-12.5 MG per tablet Take 1 tablet by mouth daily.     metoprolol tartrate (LOPRESSOR) 50 MG tablet Take 50 mg by mouth daily.   Multiple Vitamins-Minerals (ZINC PO) Take 1 tablet by mouth daily.     Allergies:   Codeine, Demerol, Methimazole, Morphine, and Penicillins    Social History   Socioeconomic History   Marital status: Single    Spouse name: Not on file   Number of children: Not on file   Years of education: Not on file   Highest education level: Not on file  Occupational History   Not on file  Tobacco Use   Smoking status: Former    Types: Cigarettes   Smokeless tobacco: Never  Vaping Use   Vaping Use: Never used  Substance and Sexual Activity   Alcohol use: No   Drug use: No   Sexual activity: Not on file  Other Topics Concern   Not on file  Social History Narrative   Not on file   Social Determinants of Health   Financial Resource Strain: Not on file  Food Insecurity: Not on file  Transportation Needs: Not on file  Physical Activity: Not on file  Stress: Not on file  Social Connections: Not on file     Family History: The patient's family history includes Diabetes in her brother; Heart attack in her father; Hypertension in her brother and mother.  ROS:   Please see the history of present illness.     All other systems reviewed and are negative.  EKGs/Labs/Other Studies Reviewed:    The following studies were reviewed today: CT of the abdomen 04/19/2019  EKG:  EKG is not ordered today.  The ekg ordered 02/18/2023 is personally reviewed and demonstrates normal sinus rhythm with left bundle branch block.  QTc 449 ms.  Previous ECG from 2012 shows narrow QRS.  Recent Labs: 02/18/2023: ALT 65; BUN 20; Creatinine, Ser 1.09; Hemoglobin 15.6; Platelets 245; Potassium 4.0; Sodium 136  Recent Lipid Panel No results found for: "CHOL", "TRIG", "HDL", "CHOLHDL", "VLDL", "LDLCALC", "LDLDIRECT" 08/08/2022 Cholesterol 161, HDL 49, LDL 91, triglycerides 125  Risk Assessment/Calculations:                Physical Exam:    VS:  BP (!) 130/56 (BP Location: Left Arm, Patient Position: Sitting, Cuff Size: Large)   Pulse 71   Ht  (1.727 m)   Wt 220 lb 6.4 oz (100 kg)   SpO2 96%   BMI 33.51 kg/m     Wt Readings from  Last 3 Encounters:  02/23/23 220 lb 6.4 oz (100 kg)  02/18/23 215 lb (97.5 kg)  10/28/11 202 lb 12.8 oz (92 kg)     GEN: Mildly obese, cushingoid, well nourished, well developed in no acute distress HEENT: Normal NECK: No JVD; No carotid bruits LYMPHATICS: No lymphadenopathy CARDIAC: RRR, no murmurs, rubs, gallops RESPIRATORY:  Clear to auscultation without rales, wheezing or rhonchi  ABDOMEN: Soft, non-tender, non-distended MUSCULOSKELETAL:  No edema; No deformity  SKIN: Warm and dry NEUROLOGIC:  Alert and oriented x 3 PSYCHIATRIC:  Normal affect   ASSESSMENT:    1. Left bundle branch block   2. Essential hypertension   3. Diabetes mellitus due to underlying condition,  uncontrolled, with hyperglycemia   4. Atherosclerosis of aorta   5. Adrenal mass, left   6. Cushing syndrome   7. Postablative hypothyroidism   8. History of atrial fibrillation    PLAN:    In order of problems listed above:  LBBB: Incidental and asymptomatic finding.  Will check an echocardiogram, but I do not think this abnormality will interfere with her planned adrenalectomy, unless we discover that she has moderate or severely depressed left ventricular function.  Would not be surprised if her LVEF is in the low normal range due to dyssynchrony.  She has no signs or symptoms of congestive heart failure.  She does not have any symptoms to suggest coronary insufficiency.  No events that suggest that she has had problems with higher grade AV block. HTN: Well-controlled. DM: Secondary to Cushing syndrome.  Poorly controlled. Aortic atherosclerosis: Looks fairly scanty limited to the abdominal aorta on my review.  I cannot see any evidence of aortic atherosclerosis in the visualized portions of the coronary arteries and thoracic aorta, although the field-of-view was incomplete.  Current lipid profile is not bad and is likely to improve further after correction of her endocrine problems.  Monitor for now. Left  adrenal mass: Hopefully resection of this adenoma will lead to resolution of her endocrine problems. Hypothyroidism: Clinically euthyroid, normal TSH. Remote history of paroxysmal atrial fibrillation during thyrotoxicosis in 2012, no recurrence of atrial fibrillation since.           Medication Adjustments/Labs and Tests Ordered: Current medicines are reviewed at length with the patient today.  Concerns regarding medicines are outlined above.  Orders Placed This Encounter  Procedures   ECHOCARDIOGRAM COMPLETE   No orders of the defined types were placed in this encounter.   Patient Instructions  Medication Instructions:  No changes *If you need a refill on your cardiac medications before your next appointment, please call your pharmacy*  Testing/Procedures: Your physician has requested that you have an echocardiogram. Echocardiography is a painless test that uses sound waves to create images of your heart. It provides your doctor with information about the size and shape of your heart and how well your heart's chambers and valves are working. This procedure takes approximately one hour. There are no restrictions for this procedure. Please do NOT wear cologne, perfume, aftershave, or lotions (deodorant is allowed). Please arrive 15 minutes prior to your appointment time.    Follow-Up: At [facility], you and your health needs are our priority.  As part of our continuing mission to provide you with exceptional heart care, we have created designated Provider Care Teams.  These Care Teams include your primary Cardiologist (physician) and Advanced Practice Providers (APPs -  Physician Assistants and Nurse Practitioners) who all work together to provide you with the care you need, when you need it.  We recommend signing up for the patient portal called "MyChart".  Sign up information is provided on this After Visit Summary.  MyChart is used to connect with patients for Virtual  Visits (Telemedicine).  Patients are able to view lab/test results, encounter notes, upcoming appointments, etc.  Non-urgent messages can be sent to your provider as well.   To learn more about what you can do with MyChart, go to [email or web address].    Your next appointment:    Follow up as needed  Provider:   Dr Royann Shivers    Signed, Thurmon Fair, MD  02/24/2023 2:25 PM    [facility]

## 2023-02-27 ENCOUNTER — Ambulatory Visit (INDEPENDENT_AMBULATORY_CARE_PROVIDER_SITE_OTHER): Payer: 59

## 2023-02-27 DIAGNOSIS — Z8679 Personal history of other diseases of the circulatory system: Secondary | ICD-10-CM | POA: Diagnosis not present

## 2023-02-27 DIAGNOSIS — I447 Left bundle-branch block, unspecified: Secondary | ICD-10-CM

## 2023-02-27 LAB — ECHOCARDIOGRAM COMPLETE
AR max vel: 2.77 cm2
AV Area VTI: 2.96 cm2
AV Area mean vel: 2.62 cm2
AV Mean grad: 3 mmHg
AV Peak grad: 7.3 mmHg
AV Vena cont: 0.28 cm
Ao pk vel: 1.35 m/s
Area-P 1/2: 2.54 cm2
P 1/2 time: 471 msec
S' Lateral: 3.42 cm

## 2023-02-27 MED ORDER — PERFLUTREN LIPID MICROSPHERE
1.0000 mL | INTRAVENOUS | Status: AC | PRN
Start: 2023-02-27 — End: 2023-02-27
  Administered 2023-02-27: 3 mL via INTRAVENOUS

## 2023-03-05 ENCOUNTER — Other Ambulatory Visit: Payer: Self-pay | Admitting: Urology

## 2023-03-06 NOTE — Anesthesia Preprocedure Evaluation (Addendum)
Anesthesia Evaluation  Patient identified by MRN, date of birth, ID band Patient awake    Reviewed: Allergy & Precautions, NPO status , Patient's Chart, lab work & pertinent test results  History of Anesthesia Complications (+) PONV and history of anesthetic complications  Airway Mallampati: II  TM Distance: >3 FB Neck ROM: Full    Dental  (+) Dental Advisory Given   Pulmonary neg shortness of breath, neg sleep apnea, neg COPD, neg recent URI, former smoker   Pulmonary exam normal breath sounds clear to auscultation       Cardiovascular hypertension (lisinopril-HCTZ, metoprolol), Pt. on medications and Pt. on home beta blockers (-) angina (-) Past MI, (-) Cardiac Stents and (-) CABG + dysrhythmias (afib resolved after thyroid ablation, LBBB) Atrial Fibrillation + Valvular Problems/Murmurs MR and AI  Rhythm:Regular Rate:Normal  HLD  TTE 02/27/2023: IMPRESSIONS     1. Left ventricular ejection fraction by 3D volume is 56 %. The left  ventricle has normal function. The left ventricle has no regional wall  motion abnormalities. Left ventricular diastolic parameters are consistent  with Grade I diastolic dysfunction  (impaired relaxation). The average left ventricular global longitudinal  strain is -13.8 %. The global longitudinal strain is abnormal.   2. Right ventricular systolic function is normal. The right ventricular  size is normal.   3. The mitral valve is normal in structure. Mild mitral valve  regurgitation.   4. The aortic valve is normal in structure. Aortic valve regurgitation is  mild to moderate. No aortic stenosis is present.     Neuro/Psych  Headaches, neg Seizures    GI/Hepatic negative GI ROS,,,Fatty liver   Endo/Other  diabetes (Hgb A1c 8.9), Poorly Controlled, Type 2, Oral Hypoglycemic AgentsHypothyroidism  Cushing's  Renal/GU negative Renal ROS     Musculoskeletal   Abdominal  (+) + obese   Peds  Hematology negative hematology ROS (+)   Anesthesia Other Findings Last Ozempic: 04/02/2023  Reproductive/Obstetrics                             Anesthesia Physical Anesthesia Plan  ASA: 3  Anesthesia Plan: General   Post-op Pain Management: Tylenol PO (pre-op)*   Induction: Intravenous  PONV Risk Score and Plan: 4 or greater and Ondansetron, Dexamethasone, Propofol infusion, Midazolam and Scopolamine patch - Pre-op  Airway Management Planned: Oral ETT  Additional Equipment:   Intra-op Plan:   Post-operative Plan: Extubation in OR  Informed Consent: I have reviewed the patients History and Physical, chart, labs and discussed the procedure including the risks, benefits and alternatives for the proposed anesthesia with the patient or authorized representative who has indicated his/her understanding and acceptance.     Dental advisory given  Plan Discussed with: CRNA and Anesthesiologist  Anesthesia Plan Comments: (See PAT note from [facility] and Terance Hart on 4/10  Risks of general anesthesia discussed including, but not limited to, sore throat, hoarse voice, chipped/damaged teeth, injury to vocal cords, nausea and vomiting, allergic reactions, lung infection, heart attack, stroke, and death. All questions answered. )        Anesthesia Quick Evaluation

## 2023-03-06 NOTE — Progress Notes (Signed)
Choose an anesthesia record to view details        DISCUSSION: Anne Mcintyre is a 57 yo female who presented to PAT clinic on 4/10 prior to left adrenalectomy. On screening EKG she was found to have a new LBBB. She was send to Cardiology for clearance and had an echo done which was normal. Surgery is now rescheduled for 04/13/23  Per Dr. Royann Shivers at that visit:   "LBBB: Incidental and asymptomatic finding.  Will check an echocardiogram, but I do not think this abnormality will interfere with her planned adrenalectomy, unless we discover that she has moderate or severely depressed left ventricular function.  Would not be surprised if her LVEF is in the low normal range due to dyssynchrony.  She has no signs or symptoms of congestive heart failure.  She does not have any symptoms."   Following echo resulted he commented: "Other than left bundle branch block related lack of synchrony, with associated borderline left ventricular systolic function, this is a normal study. Low risk for surgery. Sending note to Dr. Laverle Patter."  Regarding aortic atherosclerosis:  "Aortic atherosclerosis: Looks fairly scanty limited to the abdominal aorta on my review.  I cannot see any evidence of aortic atherosclerosis in the visualized portions of the coronary arteries and thoracic aorta, although the field-of-view was incomplete.  Current lipid profile is not bad and is likely to improve further after correction of her endocrine problems.  Monitor for now."  Has hx of A.fib. Not anticoagulated.  VS: BP 130/76   Pulse 69   Temp 37 C (Oral)   Resp 16   Ht 5\' 8"  (1.727 m)   Wt 97.5 kg   SpO2 95%   BMI 32.69 kg/m   PROVIDERS: [facility]: Labs reviewed: Acceptable for surgery. (all labs ordered are listed, but only abnormal results are displayed)  Labs Reviewed  CBC - Abnormal; Notable for the following components:      Result Value   Hemoglobin 15.6 (*)    HCT 46.1 (*)    All  other components within normal limits  COMPREHENSIVE METABOLIC PANEL - Abnormal; Notable for the following components:   Glucose, Bld 201 (*)    Creatinine, Ser 1.09 (*)    AST 56 (*)    ALT 65 (*)    GFR, Estimated 59 (*)    All other components within normal limits  GLUCOSE, CAPILLARY - Abnormal; Notable for the following components:   Glucose-Capillary 187 (*)    All other components within normal limits  HEMOGLOBIN A1C - Abnormal; Notable for the following components:   Hgb A1c MFr Bld 8.9 (*)    All other components within normal limits  TYPE AND SCREEN     IMAGES:  CT Abdomen/Pelvis 04/18/21:  IMPRESSION: 32 mm left adrenal adenoma, stable by report from prior examination.   Moderate hepatic steatosis.   Aortic Atherosclerosis (ICD10-I70.0).   EKG 02/18/23:  Sinus rhythm with LBBB   CV:  Echo 02/27/23:  IMPRESSIONS     1. Left ventricular ejection fraction by 3D volume is 56 %. The left  ventricle has normal function. The left ventricle has no regional wall  motion abnormalities. Left ventricular diastolic parameters are consistent  with Grade I diastolic dysfunction  (impaired relaxation). The average left ventricular global longitudinal  strain is -13.8 %. The global longitudinal strain is abnormal.   2. Right ventricular systolic function is normal. The right ventricular  size is normal.  3. The mitral valve is normal in structure. Mild mitral valve  regurgitation.   4. The aortic valve is normal in structure. Aortic valve regurgitation is  mild to moderate. No aortic stenosis is present.   Past Medical History:  Diagnosis Date   Atrial fibrillation (HCC)    Cushing's disease (HCC)    Diabetes mellitus without complication (HCC)    Headache    Hypertension    Hyperthyroidism    POSSIBLE GRAVE'S DISEASE   PONV (postoperative nausea and vomiting)    Shingles    hx of 01/18/23   Syncope and collapse    PRESYNCOPE    Past Surgical History:   Procedure Laterality Date   CESAREAN SECTION     x 2 v   ELBOW SURGERY     tonsilllectomy       MEDICATIONS:  linaclotide (LINZESS) 72 MCG capsule   Ascorbic Acid (VITAMIN C) 1000 MG tablet   aspirin EC 81 MG tablet   atorvastatin (LIPITOR) 10 MG tablet   budesonide (RHINOCORT ALLERGY) 32 MCG/ACT nasal spray   Calcium Carbonate-Vit D-Min (CALCIUM 600 + MINERALS) 600-200 MG-UNIT TABS   dapagliflozin propanediol (FARXIGA) 10 MG TABS tablet   diphenhydrAMINE (BENADRYL) 25 MG tablet   Dulaglutide (TRULICITY) 4.5 MG/0.5ML SOPN   eletriptan (RELPAX) 40 MG tablet   hydrocortisone (CORTEF) 10 MG tablet   ibuprofen (ADVIL) 200 MG tablet   levothyroxine (SYNTHROID, LEVOTHROID) 125 MCG tablet   lisinopril-hydrochlorothiazide (PRINZIDE,ZESTORETIC) 10-12.5 MG per tablet   loratadine (CLARITIN) 10 MG tablet   LORazepam (ATIVAN) 0.5 MG tablet   metoprolol tartrate (LOPRESSOR) 50 MG tablet   Multiple Vitamins-Minerals (ZINC PO)   No current facility-administered medications for this encounter.   Marcille Blanco [facility] Surgical Short Stay/Anesthesiology [facility] Phone [phone] 03/06/2023 9:27 AM

## 2023-04-07 NOTE — Progress Notes (Signed)
Patient interviewed over the phone. Needs  labs DOS. Emailed instructions to [email or web address] per pt preference. She already has soap from previous PAT appointment.   COVID Vaccine Completed: no  Date of COVID positive in last 90 days: no  PCP - [facility] [place], [place] Cardiologist - Thurmon Fair, MD LOV 02/23/23  Chest x-ray - n/a EKG - 02/18/23 Epic/chart Stress Test - n/a ECHO - 02/27/23 Epic Cardiac Cath - n/a Pacemaker/ICD device last checked: n/a Spinal Cord Stimulator: n/a  Bowel Prep - yes, clear liquids day before, magnesium citrate. Patient has instructions  Sleep Study - n/a CPAP -   Fasting Blood Sugar - 150-220 Checks Blood Sugar 4 times a day  Last dose of GLP1 agonist-  Ozempic, hold 7 days before. GLP1 instructions: Last dose 04/02/23. Do not take 04/09/23  Hold Farxiga for 3 days before surgery. Last dose 04/09/23.   Last dose of SGLT-2 inhibitors-  N/A SGLT-2 instructions: N/A   Blood Thinner Instructions:  Time Aspirin Instructions: ASA 81, no instructions per pt. Instructed to call PCP  Last Dose:  Activity level: Can go up a flight of stairs and perform activities of daily living without stopping and without symptoms of chest pain or shortness of breath.  Anesthesia review: a fib, HTN, LBBB, DM2  Patient denies shortness of breath, fever, cough and chest pain at PAT appointment  Patient verbalized understanding of instructions that were given to them at the PAT appointment. Patient was also instructed that they will need to review over the PAT instructions again at home before surgery.

## 2023-04-07 NOTE — Patient Instructions (Signed)
SURGICAL WAITING ROOM VISITATION  Patients having surgery or a procedure may have no more than 2 support people in the waiting area - these visitors may rotate.    Children under the age of 31 must have an adult with them who is not the patient.  Due to an increase in RSV and influenza rates and associated hospitalizations, children ages 49 and under may not visit patients in [facility] hospitals.  If the patient needs to stay at the hospital during part of their recovery, the visitor guidelines for inpatient rooms apply. Pre-op nurse will coordinate an appropriate time for 1 support person to accompany patient in pre-op.  This support person may not rotate.    Please refer to the [facility] website for the visitor guidelines for Inpatients (after your surgery is over and you are in a regular room).    Your procedure is scheduled on: 04/13/23   Report to [facility] Main Entrance    Report to admitting at 5:15 AM   Call this number if you have problems the morning of surgery [phone]   Follow a clear liquid diet the day before surgery.  Water Non-Citrus Juices (without pulp, NO RED-Apple, White grape, White cranberry) Black Coffee (NO MILK/CREAM OR CREAMERS, sugar ok)  Clear Tea (NO MILK/CREAM OR CREAMERS, sugar ok) regular and decaf                             Plain Jell-O (NO RED)                                           Fruit ices (not with fruit pulp, NO RED)                                     Popsicles (NO RED)                                                               Sports drinks like Gatorade (NO RED)     Nothing to drink after midnight         If you have questions, please contact your surgeon's office.   FOLLOW BOWEL PREP AND ANY ADDITIONAL PRE OP INSTRUCTIONS YOU RECEIVED FROM YOUR SURGEON'S OFFICE!!!     Oral Hygiene is also important to reduce your risk of infection.                                    Remember - BRUSH YOUR TEETH THE MORNING OF  SURGERY WITH YOUR REGULAR TOOTHPASTE  DENTURES WILL BE REMOVED PRIOR TO SURGERY PLEASE DO NOT APPLY "Poly grip" OR ADHESIVES!!!   Take these medicines the morning of surgery with A SIP OF WATER: Synthroid, Lorazepam, Metoprolol   DO NOT TAKE ANY ORAL DIABETIC MEDICATIONS DAY OF YOUR SURGERY  How to Manage Your Diabetes Before and After Surgery  Why is it important to control my blood sugar before and after surgery? Improving blood sugar levels  before and after surgery helps healing and can limit problems. A way of improving blood sugar control is eating a healthy diet by:  Eating less sugar and carbohydrates  Increasing activity/exercise  Talking with your doctor about reaching your blood sugar goals High blood sugars (greater than 180 mg/dL) can raise your risk of infections and slow your recovery, so you will need to focus on controlling your diabetes during the weeks before surgery. Make sure that the doctor who takes care of your diabetes knows about your planned surgery including the date and location.  How do I manage my blood sugar before surgery? Check your blood sugar at least 4 times a day, starting 2 days before surgery, to make sure that the level is not too high or low. Check your blood sugar the morning of your surgery when you wake up and every 2 hours until you get to the Short Stay unit. If your blood sugar is less than 70 mg/dL, you will need to treat for low blood sugar: Do not take insulin. Treat a low blood sugar (less than 70 mg/dL) with  cup of clear juice (cranberry or apple), 4 glucose tablets, OR glucose gel. Recheck blood sugar in 15 minutes after treatment (to make sure it is greater than 70 mg/dL). If your blood sugar is not greater than 70 mg/dL on recheck, call [phone] for further instructions. Report your blood sugar to the short stay nurse when you get to Short Stay.  If you are admitted to the hospital after surgery: Your blood sugar will be  checked by the staff and you will probably be given insulin after surgery (instead of oral diabetes medicines) to make sure you have good blood sugar levels. The goal for blood sugar control after surgery is 80-180 mg/dL.   WHAT DO I DO ABOUT MY DIABETES MEDICATION?  Do not take oral diabetes medicines (pills) the morning of surgery.  Hold Farxiga for 3 days prior to surgery. Last dose 04/09/23.  Hold Ozempic for 7 days prior to surgery. Do not take 04/09/23.  THE DAY BEFORE SURGERY, take Novolog before meals as prescribed.      THE MORNING OF SURGERY, do not take Novolog unless blood sugar is greater than 220, then take 50% of dose.  DO NOT TAKE THE FOLLOWING 7 DAYS PRIOR TO SURGERY: Ozempic, Wegovy, Rybelsus (Semaglutide), Byetta (exenatide), Bydureon (exenatide ER), Victoza, Saxenda (liraglutide), or Trulicity (dulaglutide) Mounjaro (Tirzepatide) Adlyxin (Lixisenatide), Polyethylene Glycol Loxenatide.   Reviewed and Endorsed by [facility] Patient Education Committee, August 2015                              You may not have any metal on your body including hair pins, jewelry, and body piercing             Do not wear make-up, lotions, powders, perfumes, or deodorant  Do not wear nail polish including gel and S&S, artificial/acrylic nails, or any other type of covering on natural nails including finger and toenails. If you have artificial nails, gel coating, etc. that needs to be removed by a nail salon please have this removed prior to surgery or surgery may need to be canceled/ delayed if the surgeon/ anesthesia feels like they are unable to be safely monitored.   Do not shave  48 hours prior to surgery.    Do not bring valuables to the hospital. [place] [facility] IS NOT  RESPONSIBLE   FOR VALUABLES.   Contacts, glasses, dentures or bridgework may not be worn into surgery.   Bring small overnight bag day of surgery.   DO NOT BRING YOUR HOME MEDICATIONS TO THE  HOSPITAL. PHARMACY WILL DISPENSE MEDICATIONS LISTED ON YOUR MEDICATION LIST TO YOU DURING YOUR ADMISSION IN THE HOSPITAL!               Please read over the following fact sheets you were given: IF YOU HAVE QUESTIONS ABOUT YOUR PRE-OP INSTRUCTIONS PLEASE CALL [phone] Mcintyre   If you received a COVID test during your pre-op visit  it is requested that you wear a mask when out in public, stay away from anyone that may not be feeling well and notify your surgeon if you develop symptoms. If you test positive for Covid or have been in contact with anyone that has tested positive in the last 10 days please notify you surgeon.     - Preparing for Surgery Before surgery, you can play an important role.  Because skin is not sterile, your skin needs to be as free of germs as possible.  You can reduce the number of germs on your skin by washing with CHG (chlorahexidine gluconate) soap before surgery.  CHG is an antiseptic cleaner which kills germs and bonds with the skin to continue killing germs even after washing. Please DO NOT use if you have an allergy to CHG or antibacterial soaps.  If your skin becomes reddened/irritated stop using the CHG and inform your nurse when you arrive at Short Stay. Do not shave (including legs and underarms) for at least 48 hours prior to the first CHG shower.  You may shave your face/neck.  Please follow these instructions carefully:  1.  Shower with CHG Soap the night before surgery and the  morning of surgery.  2.  If you choose to wash your hair, wash your hair first as usual with your normal  shampoo.  3.  After you shampoo, rinse your hair and body thoroughly to remove the shampoo.                             4.  Use CHG as you would any other liquid soap.  You can apply chg directly to the skin and wash.  Gently with a scrungie or clean washcloth.  5.  Apply the CHG Soap to your body ONLY FROM THE NECK DOWN.   Do   not use on face/ open                            Wound or open sores. Avoid contact with eyes, ears mouth and   genitals (private parts).                       Wash face,  Genitals (private parts) with your normal soap.             6.  Wash thoroughly, paying special attention to the area where your    surgery  will be performed.  7.  Thoroughly rinse your body with warm water from the neck down.  8.  DO NOT shower/wash with your normal soap after using and rinsing off the CHG Soap.                9.  Pat yourself dry with a clean towel.  10.  Wear clean pajamas.            11.  Place clean sheets on your bed the night of your first shower and do not  sleep with pets. Day of Surgery : Do not apply any lotions/deodorants the morning of surgery.  Please wear clean clothes to the hospital/surgery center.  FAILURE TO FOLLOW THESE INSTRUCTIONS MAY RESULT IN THE CANCELLATION OF YOUR SURGERY  PATIENT SIGNATURE_________________________________  NURSE SIGNATURE__________________________________  ________________________________________________________________________

## 2023-04-08 ENCOUNTER — Encounter (HOSPITAL_COMMUNITY)
Admission: RE | Admit: 2023-04-08 | Discharge: 2023-04-08 | Disposition: A | Discharge status: Home / Self Care | Payer: 59 | Source: Ambulatory Visit | Attending: Urology | Admitting: Urology

## 2023-04-08 ENCOUNTER — Other Ambulatory Visit: Payer: Self-pay

## 2023-04-08 ENCOUNTER — Encounter (HOSPITAL_COMMUNITY): Payer: Self-pay

## 2023-04-08 VITALS — Ht 68.0 in | Wt 210.0 lb

## 2023-04-08 DIAGNOSIS — E119 Type 2 diabetes mellitus without complications: Secondary | ICD-10-CM

## 2023-04-08 HISTORY — DX: Fatty (change of) liver, not elsewhere classified: K76.0

## 2023-04-09 ENCOUNTER — Ambulatory Visit: Payer: 59 | Admitting: Cardiovascular Disease

## 2023-04-10 NOTE — H&P (Signed)
Office Visit Report     03/17/2023   --------------------------------------------------------------------------------   Anne Mcintyre  MRN: [id number]  DOB: 13-Aug-1966, 58 year old Female  SSN:    PRIMARY CARE:  Tonita Cong, MD  PRIMARY CARE FAX:  [phone]  REFERRING:  Azucena Kuba  PROVIDER:  Heloise Purpura, M.D.  TREATING:  Ulyses Amor, [place]  LOCATION:  [facility] [phone]     --------------------------------------------------------------------------------   CC/HPI: Pt presents today for pre-operative history and physical exam in anticipation of robotic assisted lap left adrenalectomy by Dr. Laverle Patter on 04/13/23. She is doing well. Shingles have resolved. She did have to have a molar removed 2 weeks ago but has done well with the procedure and has no residual issues.   She received cardiac clearance from Dr. Rubie Maid.   Pt denies F/C, HA, CP, SOB, N/V, diarrhea/constipation, back pain, flank pain, hematuria, and dysuria.    HX:   Left adrenal mass   [name] is a 57 year old lady who is seen at the request of Dr. Debara Pickett for a left adrenal adenoma and suspected ACTH-independent Cushing's syndrome. Her medical history is significant for diabetes, hyperlipidemia, migraine headaches, hypertension, anxiety, atrial fibrillation, and IBS. She has a history of Grave's disease and is on thyroid replacement s/p ablative treatment. Overall, she has remained in relatively stable health. She has quit smoking as of a year ago. She has had some increased difficulties managing her blood glucose. She has also gained approximately 15 pounds. She denies any new cardiac or pulmonary issues. She has not had abdominal surgery since her last visit with me.   She has had evidence of Cushing's syndrome on biochemical evaluation with mild elevation of serum cortisol at 21.2 ug/dl and low ACTH at 6.9 pg/ml on 03/04/21. She has symptoms including hyperglycemia,  hypertension, and obesity. A low dose dexamethasone test was performed in March 2022 with suppression but incomplete suppression of cortisol (2.5 ug/dl). She has a known 3.2 cm left adrenal adenoma. This was most recently imaged on 04/18/21 with a non-contrast CT of the abdomen indicating a stable left adrenal mass consistent with adenoma by imaging criteria.   I had initially seen her in October 2022 for surgical consultation to consider left minimally invasive adrenalectomy. She has considered her options and had further discussions with Dr. Sharl Ma and now feels that she would like to proceed with adrenalectomy this year.     ALLERGIES: Codeine Sulfate - Hives Demerol - Nausea, Vomiting methimazole - Hives Morphine Sulfate - Vomiting, Nausea Penicillin - Hives Ragweed     Notes: Dilaudid N/V  prozac hives    MEDICATIONS: Aspirin 81 mg tablet,chewable  Metoprolol Succinate 50 mg tablet, extended release 24 hr  Atorvastatin Calcium 10 mg tablet  Eletriptan Hbr 40 mg tablet  Farxiga 5 mg tablet  Lisinopril-Hydrochlorothiazide 10 mg-12.5 mg tablet  Lorazepam 0.5 mg tablet  Synthroid 125 mcg tablet  Valtrex     GU PSH: Locm 300-399Mg /Ml Iodine,1Ml - 01/16/2023       PSH Notes: C-Section x2 - 1985, 1990  Tennis Elbow Tendon Release - 1995, 1996   NON-GU PSH: Tonsillectomy - 1981     GU PMH: None     PMH Notes: IBS   afib resolved after treatment of Graves disease    NON-GU PMH: Left adrenal neoplasm - 01/27/2023, - 01/16/2023, - 12/09/2022, - 08/30/2021 Cushing's syndrome, unspecified - 12/09/2022 Anxiety Atrial Fibrillation Diabetes Type 2 Hypercholesterolemia Hypertension  Hyperthyroidism Hypothyroidism    FAMILY HISTORY: 1 Daughter - Runs in Family 1 son - Runs in Family Cirrhosis - Runs in Family Diabetes - Runs in Family   SOCIAL HISTORY: Marital Status: Divorced Ethnicity: Not Hispanic Or Latino; Race: White Current Smoking Status: Patient does not smoke anymore.    Tobacco Use Assessment Completed: Used Tobacco in last 30 days? Does not use smokeless tobacco. Has never drank.  Does not use drugs. Drinks 1 caffeinated drink per day. Has not had a blood transfusion.     Notes: Quit smoking 1 year ago    REVIEW OF SYSTEMS:    GU Review Female:   Patient denies frequent urination, hard to postpone urination, burning /pain with urination, get up at night to urinate, leakage of urine, stream starts and stops, trouble starting your stream, have to strain to urinate, and being pregnant.  Gastrointestinal (Upper):   Patient denies nausea, vomiting, and indigestion/ heartburn.  Gastrointestinal (Lower):   Patient denies diarrhea and constipation.  Constitutional:   Patient denies fever, night sweats, weight loss, and fatigue.  Skin:   Patient denies skin rash/ lesion and itching.  Eyes:   Patient denies blurred vision and double vision.  Ears/ Nose/ Throat:   Patient denies sore throat and sinus problems.  Hematologic/Lymphatic:   Patient denies swollen glands and easy bruising.  Cardiovascular:   Patient denies leg swelling and chest pains.  Respiratory:   Patient denies cough and shortness of breath.  Endocrine:   Patient denies excessive thirst.  Musculoskeletal:   Patient denies back pain and joint pain.  Neurological:   Patient denies headaches and dizziness.  Psychologic:   Patient denies depression and anxiety.   VITAL SIGNS:      03/17/2023 02:42 PM  BP 125/60 mmHg  Pulse 72 /min  Temperature 96.4 F / 35.7 C   MULTI-SYSTEM PHYSICAL EXAMINATION:    Constitutional: Well-nourished. No physical deformities. Normally developed. Good grooming.  Neck: Neck symmetrical, not swollen. Normal tracheal position.  Respiratory: Normal breath sounds. No labored breathing, no use of accessory muscles.   Cardiovascular: Regular rate and rhythm. No murmur, no gallop.   Lymphatic: No enlargement of neck, axillae, groin.  Skin: No paleness, no jaundice, no  cyanosis. No lesion, no ulcer, no rash.  Neurologic / Psychiatric: Oriented to time, oriented to place, oriented to person. No depression, no anxiety, no agitation.  Gastrointestinal: No mass, no tenderness, no rigidity, non obese abdomen.  Eyes: Normal conjunctivae. Normal eyelids.  Ears, Nose, Mouth, and Throat: Left ear no scars, no lesions, no masses. Right ear no scars, no lesions, no masses. Nose no scars, no lesions, no masses. Normal hearing. Normal lips.  Musculoskeletal: Normal gait and station of head and neck.     Complexity of Data:  Records Review:   Previous Patient Records  Urine Test Review:   Urinalysis   03/17/23  Urinalysis  Urine Appearance Clear   Urine Color Straw   Urine Glucose 3+ mg/dL  Urine Bilirubin Neg mg/dL  Urine Ketones Neg mg/dL  Urine Specific Gravity 1.020   Urine Blood Neg ery/uL  Urine pH 5.5   Urine Protein Neg mg/dL  Urine Urobilinogen 0.2 mg/dL  Urine Nitrites Neg   Urine Leukocyte Esterase Neg leu/uL   PROCEDURES:          Urinalysis - 81003 Dipstick Dipstick Cont'd  Color: Straw Bilirubin: Neg mg/dL  Appearance: Clear Ketones: Neg mg/dL  Specific Gravity: 1.610 Blood: Neg ery/uL  pH: 5.5 Protein:  Neg mg/dL  Glucose: 3+ mg/dL Urobilinogen: 0.2 mg/dL    Nitrites: Neg    Leukocyte Esterase: Neg leu/uL    ASSESSMENT:      ICD-10 Details  1 NON-GU:   Left adrenal neoplasm - D44.12    PLAN:           Schedule Return Visit/Planned Activity: Keep Scheduled Appointment - Schedule Surgery          Document Letter(s):  Created for Patient: Clinical Summary         Notes:   There are no changes in the patients history or physical exam since last evaluation by Dr. Laverle Patter. Pt is scheduled to undergo RAL adrenalectomy on 04/13/23.   All pt's questions were answered to the best of my ability.          Next Appointment:      Next Appointment: 04/13/2023 07:15 AM    Appointment Type: Surgery     Location: [facility] [phone]    Provider: Heloise Purpura, M.D.    Reason for Visit: WL/IP (L) RA LAP ADRENALECTOMY WITH AMANDA      * Signed by Ulyses Amor, PA on 03/17/23 at 2:52 PM (EDT)*

## 2023-04-13 ENCOUNTER — Inpatient Hospital Stay (HOSPITAL_COMMUNITY): Payer: 59 | Admitting: Medical

## 2023-04-13 ENCOUNTER — Other Ambulatory Visit: Payer: Self-pay

## 2023-04-13 ENCOUNTER — Inpatient Hospital Stay (HOSPITAL_COMMUNITY)
Admission: RE | Admit: 2023-04-13 | Discharge: 2023-04-14 | DRG: 614 | Disposition: A | Discharge status: Home / Self Care | Payer: 59 | Source: Ambulatory Visit | Attending: Urology | Admitting: Urology

## 2023-04-13 ENCOUNTER — Encounter (HOSPITAL_COMMUNITY)
Admission: RE | Disposition: A | Discharge status: Home / Self Care | Payer: Self-pay | Source: Ambulatory Visit | Attending: Urology

## 2023-04-13 ENCOUNTER — Encounter (HOSPITAL_COMMUNITY): Payer: Self-pay | Admitting: Urology

## 2023-04-13 DIAGNOSIS — Z87891 Personal history of nicotine dependence: Secondary | ICD-10-CM | POA: Diagnosis not present

## 2023-04-13 DIAGNOSIS — I10 Essential (primary) hypertension: Secondary | ICD-10-CM | POA: Diagnosis present

## 2023-04-13 DIAGNOSIS — Z885 Allergy status to narcotic agent status: Secondary | ICD-10-CM

## 2023-04-13 DIAGNOSIS — E039 Hypothyroidism, unspecified: Secondary | ICD-10-CM | POA: Diagnosis present

## 2023-04-13 DIAGNOSIS — E7800 Pure hypercholesterolemia, unspecified: Secondary | ICD-10-CM | POA: Diagnosis present

## 2023-04-13 DIAGNOSIS — Z79890 Hormone replacement therapy (postmenopausal): Secondary | ICD-10-CM | POA: Diagnosis not present

## 2023-04-13 DIAGNOSIS — Z01818 Encounter for other preprocedural examination: Secondary | ICD-10-CM

## 2023-04-13 DIAGNOSIS — F419 Anxiety disorder, unspecified: Secondary | ICD-10-CM | POA: Diagnosis present

## 2023-04-13 DIAGNOSIS — K589 Irritable bowel syndrome without diarrhea: Secondary | ICD-10-CM | POA: Diagnosis present

## 2023-04-13 DIAGNOSIS — Z888 Allergy status to other drugs, medicaments and biological substances status: Secondary | ICD-10-CM | POA: Diagnosis not present

## 2023-04-13 DIAGNOSIS — Z7982 Long term (current) use of aspirin: Secondary | ICD-10-CM | POA: Diagnosis not present

## 2023-04-13 DIAGNOSIS — Z880 Allergy status to penicillin: Secondary | ICD-10-CM

## 2023-04-13 DIAGNOSIS — E1165 Type 2 diabetes mellitus with hyperglycemia: Secondary | ICD-10-CM

## 2023-04-13 DIAGNOSIS — Z833 Family history of diabetes mellitus: Secondary | ICD-10-CM | POA: Diagnosis not present

## 2023-04-13 DIAGNOSIS — E249 Cushing's syndrome, unspecified: Secondary | ICD-10-CM | POA: Diagnosis present

## 2023-04-13 DIAGNOSIS — E278 Other specified disorders of adrenal gland: Principal | ICD-10-CM | POA: Diagnosis present

## 2023-04-13 DIAGNOSIS — E119 Type 2 diabetes mellitus without complications: Secondary | ICD-10-CM

## 2023-04-13 DIAGNOSIS — Z6831 Body mass index (BMI) 31.0-31.9, adult: Secondary | ICD-10-CM | POA: Diagnosis not present

## 2023-04-13 DIAGNOSIS — Z79899 Other long term (current) drug therapy: Secondary | ICD-10-CM

## 2023-04-13 DIAGNOSIS — E243 Ectopic ACTH syndrome: Secondary | ICD-10-CM | POA: Diagnosis not present

## 2023-04-13 DIAGNOSIS — D3502 Benign neoplasm of left adrenal gland: Secondary | ICD-10-CM | POA: Diagnosis present

## 2023-04-13 DIAGNOSIS — E669 Obesity, unspecified: Secondary | ICD-10-CM | POA: Diagnosis present

## 2023-04-13 DIAGNOSIS — E785 Hyperlipidemia, unspecified: Secondary | ICD-10-CM | POA: Diagnosis present

## 2023-04-13 HISTORY — PX: ROBOTIC ADRENALECTOMY: SHX6407

## 2023-04-13 LAB — BASIC METABOLIC PANEL
Anion gap: 11 (ref 5–15)
BUN: 20 mg/dL (ref 6–20)
CO2: 21 mmol/L — ABNORMAL LOW (ref 22–32)
Calcium: 8.7 mg/dL — ABNORMAL LOW (ref 8.9–10.3)
Chloride: 102 mmol/L (ref 98–111)
Creatinine, Ser: 0.98 mg/dL (ref 0.44–1.00)
GFR, Estimated: >60 mL/min (ref >60)
Glucose, Bld: 278 mg/dL — ABNORMAL HIGH (ref 70–99)
Potassium: 3.9 mmol/L (ref 3.5–5.1)
Sodium: 134 mmol/L — ABNORMAL LOW (ref 135–145)

## 2023-04-13 LAB — GLUCOSE, CAPILLARY
Glucose-Capillary: 282 mg/dL — ABNORMAL HIGH (ref 70–99)
Glucose-Capillary: 317 mg/dL — ABNORMAL HIGH (ref 70–99)

## 2023-04-13 LAB — COMPREHENSIVE METABOLIC PANEL
ALT: 48 U/L — ABNORMAL HIGH (ref 0–44)
AST: 46 U/L — ABNORMAL HIGH (ref 15–41)
Albumin: 3.9 g/dL (ref 3.5–5.0)
Alkaline Phosphatase: 74 U/L (ref 38–126)
Anion gap: 9 (ref 5–15)
BUN: 19 mg/dL (ref 6–20)
CO2: 23 mmol/L (ref 22–32)
Calcium: 9 mg/dL (ref 8.9–10.3)
Chloride: 101 mmol/L (ref 98–111)
Creatinine, Ser: 0.96 mg/dL (ref 0.44–1.00)
GFR, Estimated: >60 mL/min (ref >60)
Glucose, Bld: 237 mg/dL — ABNORMAL HIGH (ref 70–99)
Potassium: 3.9 mmol/L (ref 3.5–5.1)
Sodium: 133 mmol/L — ABNORMAL LOW (ref 135–145)
Total Bilirubin: 0.8 mg/dL (ref 0.3–1.2)
Total Protein: 7.2 g/dL (ref 6.5–8.1)

## 2023-04-13 LAB — CBC
HCT: 42.2 % (ref 36.0–46.0)
Hemoglobin: 13.8 g/dL (ref 12.0–15.0)
MCH: 30.9 pg (ref 26.0–34.0)
MCHC: 32.7 g/dL (ref 30.0–36.0)
MCV: 94.6 fL (ref 80.0–100.0)
Platelets: 218 10*3/uL (ref 150–400)
RBC: 4.46 MIL/uL (ref 3.87–5.11)
RDW: 12.5 % (ref 11.5–15.5)
WBC: 5.9 10*3/uL (ref 4.0–10.5)
nRBC: 0 % (ref 0.0–0.2)

## 2023-04-13 LAB — HEMOGLOBIN AND HEMATOCRIT, BLOOD
HCT: 42.8 % (ref 36.0–46.0)
Hemoglobin: 14 g/dL (ref 12.0–15.0)

## 2023-04-13 SURGERY — ADRENALECTOMY, ROBOT-ASSISTED
Anesthesia: General | Site: Abdomen | Laterality: Left

## 2023-04-13 MED ORDER — TRAMADOL HCL 50 MG PO TABS
50.0000 mg | ORAL_TABLET | Freq: Four times a day (QID) | ORAL | Status: DC | PRN
  Filled 2023-04-13: qty 1

## 2023-04-13 MED ORDER — DIPHENHYDRAMINE HCL 12.5 MG/5ML PO ELIX: 12.5000 mg | ORAL_SOLUTION | Freq: Four times a day (QID) | ORAL | Status: DC | PRN

## 2023-04-13 MED ORDER — ONDANSETRON HCL 4 MG/2ML IJ SOLN
INTRAMUSCULAR | Status: AC
  Filled 2023-04-13: qty 2

## 2023-04-13 MED ORDER — LIDOCAINE HCL (PF) 2 % IJ SOLN
INTRAMUSCULAR | Status: AC
  Filled 2023-04-13: qty 5

## 2023-04-13 MED ORDER — KETOROLAC TROMETHAMINE 30 MG/ML IJ SOLN
INTRAMUSCULAR | Status: DC | PRN
  Administered 2023-04-13: 30 mg via INTRAVENOUS

## 2023-04-13 MED ORDER — KETOROLAC TROMETHAMINE 15 MG/ML IJ SOLN
15.0000 mg | Freq: Four times a day (QID) | INTRAMUSCULAR | Status: DC
  Administered 2023-04-13 – 2023-04-14 (×3): 15 mg via INTRAVENOUS
  Filled 2023-04-13 (×3): qty 1

## 2023-04-13 MED ORDER — ROCURONIUM BROMIDE 10 MG/ML (PF) SYRINGE
PREFILLED_SYRINGE | INTRAVENOUS | Status: DC | PRN
  Administered 2023-04-13 (×2): 20 mg via INTRAVENOUS
  Administered 2023-04-13: 50 mg via INTRAVENOUS
  Administered 2023-04-13 (×2): 20 mg via INTRAVENOUS

## 2023-04-13 MED ORDER — PROPOFOL 500 MG/50ML IV EMUL
INTRAVENOUS | Status: DC | PRN
  Administered 2023-04-13: 25 ug/kg/min via INTRAVENOUS

## 2023-04-13 MED ORDER — HYDROCORTISONE 5 MG PO TABS
25.0000 mg | ORAL_TABLET | Freq: Two times a day (BID) | ORAL | Status: DC
  Administered 2023-04-13 – 2023-04-14 (×2): 25 mg via ORAL
  Filled 2023-04-13 (×3): qty 1

## 2023-04-13 MED ORDER — FENTANYL CITRATE PF 50 MCG/ML IJ SOSY: 50.0000 ug | PREFILLED_SYRINGE | INTRAMUSCULAR | Status: DC | PRN

## 2023-04-13 MED ORDER — INSULIN ASPART 100 UNIT/ML IJ SOLN
0.0000 [IU] | INTRAMUSCULAR | Status: DC
  Administered 2023-04-13: 11 [IU] via SUBCUTANEOUS
  Administered 2023-04-13: 5 [IU] via SUBCUTANEOUS
  Administered 2023-04-13: 8 [IU] via SUBCUTANEOUS
  Administered 2023-04-13: 5 [IU] via SUBCUTANEOUS
  Administered 2023-04-14: 3 [IU] via SUBCUTANEOUS
  Administered 2023-04-14: 8 [IU] via SUBCUTANEOUS
  Administered 2023-04-14: 3 [IU] via SUBCUTANEOUS

## 2023-04-13 MED ORDER — LORAZEPAM 0.5 MG PO TABS: 0.2500 mg | ORAL_TABLET | Freq: Every day | ORAL | Status: DC | PRN

## 2023-04-13 MED ORDER — CEFAZOLIN SODIUM-DEXTROSE 1-4 GM/50ML-% IV SOLN
1.0000 g | Freq: Three times a day (TID) | INTRAVENOUS | Status: AC
  Administered 2023-04-13 (×2): 1 g via INTRAVENOUS
  Filled 2023-04-13 (×2): qty 50

## 2023-04-13 MED ORDER — FENTANYL CITRATE (PF) 100 MCG/2ML IJ SOLN
INTRAMUSCULAR | Status: AC
  Filled 2023-04-13: qty 2

## 2023-04-13 MED ORDER — METOPROLOL TARTRATE 50 MG PO TABS
50.0000 mg | ORAL_TABLET | Freq: Every day | ORAL | Status: DC
  Administered 2023-04-14: 50 mg via ORAL
  Filled 2023-04-13: qty 1

## 2023-04-13 MED ORDER — FENTANYL CITRATE (PF) 100 MCG/2ML IJ SOLN
INTRAMUSCULAR | Status: DC | PRN
  Administered 2023-04-13: 100 ug via INTRAVENOUS

## 2023-04-13 MED ORDER — FENTANYL CITRATE PF 50 MCG/ML IJ SOSY
25.0000 ug | PREFILLED_SYRINGE | INTRAMUSCULAR | Status: DC | PRN
  Administered 2023-04-13 (×2): 50 ug via INTRAVENOUS

## 2023-04-13 MED ORDER — ACETAMINOPHEN 500 MG PO TABS
1000.0000 mg | ORAL_TABLET | Freq: Once | ORAL | Status: AC
  Administered 2023-04-13: 1000 mg via ORAL
  Filled 2023-04-13: qty 2

## 2023-04-13 MED ORDER — ONDANSETRON HCL 4 MG/2ML IJ SOLN
INTRAMUSCULAR | Status: DC | PRN
  Administered 2023-04-13: 4 mg via INTRAVENOUS

## 2023-04-13 MED ORDER — HYDROCORTISONE SOD SUC (PF) 100 MG IJ SOLR
100.0000 mg | INTRAMUSCULAR | Status: AC
  Administered 2023-04-13: 50 mg via INTRAVENOUS
  Filled 2023-04-13: qty 2

## 2023-04-13 MED ORDER — ACETAMINOPHEN 325 MG PO TABS: 650.0000 mg | ORAL_TABLET | ORAL | Status: DC | PRN

## 2023-04-13 MED ORDER — MAGNESIUM CITRATE PO SOLN: 1.0000 | Freq: Once | ORAL | Status: DC

## 2023-04-13 MED ORDER — LACTATED RINGERS IR SOLN
Status: DC | PRN
  Administered 2023-04-13: 1000 mL

## 2023-04-13 MED ORDER — DEXAMETHASONE SODIUM PHOSPHATE 10 MG/ML IJ SOLN
INTRAMUSCULAR | Status: AC
  Filled 2023-04-13: qty 1

## 2023-04-13 MED ORDER — DOCUSATE SODIUM 100 MG PO CAPS
100.0000 mg | ORAL_CAPSULE | Freq: Two times a day (BID) | ORAL | Status: DC
  Administered 2023-04-13 – 2023-04-14 (×2): 100 mg via ORAL
  Filled 2023-04-13 (×2): qty 1

## 2023-04-13 MED ORDER — LISINOPRIL 10 MG PO TABS
10.0000 mg | ORAL_TABLET | Freq: Every day | ORAL | Status: DC
  Administered 2023-04-14: 10 mg via ORAL
  Filled 2023-04-13: qty 1

## 2023-04-13 MED ORDER — OXYCODONE HCL 5 MG PO TABS: 5.0000 mg | ORAL_TABLET | Freq: Once | ORAL | Status: DC | PRN

## 2023-04-13 MED ORDER — LINACLOTIDE 72 MCG PO CAPS
72.0000 ug | ORAL_CAPSULE | Freq: Every day | ORAL | Status: DC
  Administered 2023-04-14: 72 ug via ORAL
  Filled 2023-04-13: qty 1

## 2023-04-13 MED ORDER — ORAL CARE MOUTH RINSE: 15.0000 mL | OROMUCOSAL | Status: DC | PRN

## 2023-04-13 MED ORDER — PROPOFOL 500 MG/50ML IV EMUL
INTRAVENOUS | Status: AC
  Filled 2023-04-13: qty 50

## 2023-04-13 MED ORDER — CEFAZOLIN SODIUM-DEXTROSE 2-4 GM/100ML-% IV SOLN
2.0000 g | INTRAVENOUS | Status: AC
  Administered 2023-04-13: 2 g via INTRAVENOUS
  Filled 2023-04-13: qty 100

## 2023-04-13 MED ORDER — SCOPOLAMINE 1 MG/3DAYS TD PT72
1.0000 | MEDICATED_PATCH | Freq: Once | TRANSDERMAL | Status: DC
  Administered 2023-04-13: 1.5 mg via TRANSDERMAL
  Filled 2023-04-13: qty 1

## 2023-04-13 MED ORDER — INSULIN ASPART 100 UNIT/ML IJ SOLN
4.0000 [IU] | Freq: Once | INTRAMUSCULAR | Status: AC
  Administered 2023-04-13: 4 [IU] via SUBCUTANEOUS
  Filled 2023-04-13: qty 1

## 2023-04-13 MED ORDER — LEVOTHYROXINE SODIUM 125 MCG PO TABS
125.0000 ug | ORAL_TABLET | Freq: Every day | ORAL | Status: DC
  Administered 2023-04-14: 125 ug via ORAL
  Filled 2023-04-13: qty 1

## 2023-04-13 MED ORDER — BUPIVACAINE-EPINEPHRINE 0.25% -1:200000 IJ SOLN
INTRAMUSCULAR | Status: DC | PRN
  Administered 2023-04-13: 33 mL

## 2023-04-13 MED ORDER — LIDOCAINE 2% (20 MG/ML) 5 ML SYRINGE
INTRAMUSCULAR | Status: DC | PRN
  Administered 2023-04-13: 40 mg via INTRAVENOUS

## 2023-04-13 MED ORDER — PROPOFOL 10 MG/ML IV BOLUS
INTRAVENOUS | Status: AC
  Filled 2023-04-13: qty 20

## 2023-04-13 MED ORDER — POTASSIUM CHLORIDE IN NACL 20-0.45 MEQ/L-% IV SOLN
INTRAVENOUS | Status: DC
  Filled 2023-04-13 (×3): qty 1000

## 2023-04-13 MED ORDER — MIDAZOLAM HCL 2 MG/2ML IJ SOLN
INTRAMUSCULAR | Status: DC | PRN
  Administered 2023-04-13: 2 mg via INTRAVENOUS

## 2023-04-13 MED ORDER — CHLORHEXIDINE GLUCONATE 0.12 % MT SOLN
15.0000 mL | Freq: Once | OROMUCOSAL | Status: AC
  Administered 2023-04-13: 15 mL via OROMUCOSAL

## 2023-04-13 MED ORDER — INSULIN ASPART 100 UNIT/ML IJ SOLN
INTRAMUSCULAR | Status: AC
  Filled 2023-04-13: qty 1

## 2023-04-13 MED ORDER — AMISULPRIDE (ANTIEMETIC) 5 MG/2ML IV SOLN: 10.0000 mg | Freq: Once | INTRAVENOUS | Status: DC | PRN

## 2023-04-13 MED ORDER — ROCURONIUM BROMIDE 10 MG/ML (PF) SYRINGE
PREFILLED_SYRINGE | INTRAVENOUS | Status: AC
  Filled 2023-04-13: qty 10

## 2023-04-13 MED ORDER — LACTATED RINGERS IV SOLN: INTRAVENOUS | Status: DC

## 2023-04-13 MED ORDER — FLUTICASONE PROPIONATE 50 MCG/ACT NA SUSP: 1.0000 | Freq: Every day | NASAL | Status: DC

## 2023-04-13 MED ORDER — STERILE WATER FOR IRRIGATION IR SOLN
Status: DC | PRN
  Administered 2023-04-13: 1000 mL

## 2023-04-13 MED ORDER — LORATADINE 10 MG PO TABS: 10.0000 mg | ORAL_TABLET | Freq: Every day | ORAL | Status: DC | PRN

## 2023-04-13 MED ORDER — DEXMEDETOMIDINE HCL IN NACL 80 MCG/20ML IV SOLN
INTRAVENOUS | Status: AC
  Filled 2023-04-13: qty 20

## 2023-04-13 MED ORDER — CHLORHEXIDINE GLUCONATE 0.12 % MT SOLN: 15.0000 mL | Freq: Once | OROMUCOSAL | Status: DC

## 2023-04-13 MED ORDER — MIDAZOLAM HCL 2 MG/2ML IJ SOLN
INTRAMUSCULAR | Status: AC
  Filled 2023-04-13: qty 2

## 2023-04-13 MED ORDER — ORAL CARE MOUTH RINSE: 15.0000 mL | Freq: Once | OROMUCOSAL | Status: DC

## 2023-04-13 MED ORDER — ATORVASTATIN CALCIUM 10 MG PO TABS
10.0000 mg | ORAL_TABLET | Freq: Every day | ORAL | Status: DC
  Administered 2023-04-13 – 2023-04-14 (×2): 10 mg via ORAL
  Filled 2023-04-13 (×2): qty 1

## 2023-04-13 MED ORDER — HYDROCHLOROTHIAZIDE 12.5 MG PO TABS
12.5000 mg | ORAL_TABLET | Freq: Every day | ORAL | Status: DC
  Administered 2023-04-14: 12.5 mg via ORAL
  Filled 2023-04-13: qty 1

## 2023-04-13 MED ORDER — DEXTROSE-SODIUM CHLORIDE 5-0.45 % IV SOLN: INTRAVENOUS | Status: DC

## 2023-04-13 MED ORDER — FENTANYL CITRATE PF 50 MCG/ML IJ SOSY
PREFILLED_SYRINGE | INTRAMUSCULAR | Status: AC
  Filled 2023-04-13: qty 2

## 2023-04-13 MED ORDER — LACTATED RINGERS IV SOLN: INTRAVENOUS | Status: DC | PRN

## 2023-04-13 MED ORDER — BUPIVACAINE-EPINEPHRINE 0.25% -1:200000 IJ SOLN
INTRAMUSCULAR | Status: AC
  Filled 2023-04-13: qty 1

## 2023-04-13 MED ORDER — DIPHENHYDRAMINE HCL 50 MG/ML IJ SOLN: 12.5000 mg | Freq: Four times a day (QID) | INTRAMUSCULAR | Status: DC | PRN

## 2023-04-13 MED ORDER — DOCUSATE SODIUM 100 MG PO CAPS: 100.0000 mg | ORAL_CAPSULE | Freq: Two times a day (BID) | ORAL | Status: AC

## 2023-04-13 MED ORDER — PROPOFOL 10 MG/ML IV BOLUS
INTRAVENOUS | Status: DC | PRN
  Administered 2023-04-13: 200 mg via INTRAVENOUS

## 2023-04-13 MED ORDER — ONDANSETRON HCL 4 MG/2ML IJ SOLN: 4.0000 mg | INTRAMUSCULAR | Status: DC | PRN

## 2023-04-13 MED ORDER — DEXMEDETOMIDINE HCL IN NACL 80 MCG/20ML IV SOLN
INTRAVENOUS | Status: DC | PRN
  Administered 2023-04-13: 8 ug via INTRAVENOUS

## 2023-04-13 MED ORDER — LISINOPRIL-HYDROCHLOROTHIAZIDE 10-12.5 MG PO TABS: 1.0000 | ORAL_TABLET | Freq: Every day | ORAL | Status: DC

## 2023-04-13 MED ORDER — ORAL CARE MOUTH RINSE: 15.0000 mL | Freq: Once | OROMUCOSAL | Status: AC

## 2023-04-13 MED ORDER — PHENYLEPHRINE HCL-NACL 20-0.9 MG/250ML-% IV SOLN
INTRAVENOUS | Status: DC | PRN
  Administered 2023-04-13: 20 ug/min via INTRAVENOUS

## 2023-04-13 MED ORDER — OXYCODONE HCL 5 MG/5ML PO SOLN: 5.0000 mg | Freq: Once | ORAL | Status: DC | PRN

## 2023-04-13 MED ORDER — KETOROLAC TROMETHAMINE 30 MG/ML IJ SOLN
INTRAMUSCULAR | Status: AC
  Filled 2023-04-13: qty 1

## 2023-04-13 SURGICAL SUPPLY — 59 items
ADH SKN CLS APL DERMABOND .7 (GAUZE/BANDAGES/DRESSINGS) ×1
AGENT HMST KT MTR STRL THRMB (HEMOSTASIS)
APL ESCP 34 STRL LF DISP (HEMOSTASIS)
APL PRP STRL LF DISP 70% ISPRP (MISCELLANEOUS) ×1
APPLICATOR SURGIFLO ENDO (HEMOSTASIS) IMPLANT
BAG COUNTER SPONGE SURGICOUNT (BAG) IMPLANT
BAG LAPAROSCOPIC 12 15 PORT 16 (BASKET) IMPLANT
BAG RETRIEVAL 12/15 (BASKET)
BAG SPNG CNTER NS LX DISP (BAG)
CHLORAPREP W/TINT 26 (MISCELLANEOUS) ×2 IMPLANT
CLIP LIGATING HEM O LOK PURPLE (MISCELLANEOUS) ×2 IMPLANT
CLIP LIGATING HEMO LOK XL GOLD (MISCELLANEOUS) ×2 IMPLANT
CLIP LIGATING HEMO O LOK GREEN (MISCELLANEOUS) ×2 IMPLANT
COVER SURGICAL LIGHT HANDLE (MISCELLANEOUS) ×2 IMPLANT
COVER TIP SHEARS 8 DVNC (MISCELLANEOUS) ×2 IMPLANT
DERMABOND ADVANCED .7 DNX12 (GAUZE/BANDAGES/DRESSINGS) ×2 IMPLANT
DRAPE ARM DVNC X/XI (DISPOSABLE) ×8 IMPLANT
DRAPE COLUMN DVNC XI (DISPOSABLE) ×2 IMPLANT
DRAPE INCISE IOBAN 66X45 STRL (DRAPES) ×2 IMPLANT
DRAPE SHEET LG 3/4 BI-LAMINATE (DRAPES) ×2 IMPLANT
DRIVER NDL LRG 8 DVNC XI (INSTRUMENTS) ×4 IMPLANT
DRIVER NDLE LRG 8 DVNC XI (INSTRUMENTS) ×2 IMPLANT
ELECT REM PT RETURN 15FT ADLT (MISCELLANEOUS) ×4 IMPLANT
FORCEPS BPLR 8 MD DVNC XI (FORCEP) ×2 IMPLANT
FORCEPS PROGRASP DVNC XI (FORCEP) ×2 IMPLANT
GLOVE SURG LX STRL 7.5 STRW (GLOVE) ×4 IMPLANT
GOWN STRL REUS W/ TWL XL LVL3 (GOWN DISPOSABLE) ×4 IMPLANT
GOWN STRL REUS W/TWL XL LVL3 (GOWN DISPOSABLE) ×2
IRRIG SUCT STRYKERFLOW 2 WTIP (MISCELLANEOUS) ×1
IRRIGATION SUCT STRKRFLW 2 WTP (MISCELLANEOUS) ×2 IMPLANT
KIT BASIN OR (CUSTOM PROCEDURE TRAY) ×2 IMPLANT
KIT TURNOVER KIT A (KITS) IMPLANT
PENCIL SMOKE EVACUATOR (MISCELLANEOUS) IMPLANT
PROTECTOR NERVE ULNAR (MISCELLANEOUS) ×4 IMPLANT
RELOAD STAPLE 60 2.6 WHT THN (STAPLE) IMPLANT
RELOAD STAPLER WHITE 60MM (STAPLE) IMPLANT
SCISSORS MNPLR CVD DVNC XI (INSTRUMENTS) ×2 IMPLANT
SEAL UNIV 5-12 XI (MISCELLANEOUS) ×6 IMPLANT
SET TUBE SMOKE EVAC HIGH FLOW (TUBING) ×2 IMPLANT
SOL ELECTROSURG ANTI STICK (MISCELLANEOUS) ×1
SOLUTION ELECTROSURG ANTI STCK (MISCELLANEOUS) ×2 IMPLANT
SPIKE FLUID TRANSFER (MISCELLANEOUS) ×2 IMPLANT
STAPLE ECHEON FLEX 60 POW ENDO (STAPLE) IMPLANT
STAPLER RELOAD WHITE 60MM (STAPLE)
SURGIFLO W/THROMBIN 8M KIT (HEMOSTASIS) IMPLANT
SUT MNCRL AB 4-0 PS2 18 (SUTURE) ×4 IMPLANT
SUT PDS PLUS AB 0 CT-2 (SUTURE) ×4 IMPLANT
SUT VIC AB 0 CT1 27 (SUTURE) ×1
SUT VIC AB 0 CT1 27XBRD ANTBC (SUTURE) ×2 IMPLANT
SUT VICRYL 0 UR6 27IN ABS (SUTURE) IMPLANT
SYS BAG RETRIEVAL 10MM (BASKET) ×1
SYSTEM BAG RETRIEVAL 10MM (BASKET) ×2 IMPLANT
TOWEL OR 17X26 10 PK STRL BLUE (TOWEL DISPOSABLE) ×2 IMPLANT
TOWEL OR NON WOVEN STRL DISP B (DISPOSABLE) ×4 IMPLANT
TRAY FOLEY MTR SLVR 16FR STAT (SET/KITS/TRAYS/PACK) ×2 IMPLANT
TRAY LAPAROSCOPIC (CUSTOM PROCEDURE TRAY) ×2 IMPLANT
TROCAR ADV FIXATION 12X100MM (TROCAR) ×2 IMPLANT
TROCAR Z THREAD OPTICAL 12X100 (TROCAR) IMPLANT
WATER STERILE IRR 1000ML POUR (IV SOLUTION) ×2 IMPLANT

## 2023-04-13 NOTE — Progress Notes (Signed)
Pacu RN Report to floor given  Gave report to [employer]. Room: 1417   Discussed surgery, meds given in OR and Pacu, VS, IV fluids given, EBL, urine output, pain and other pertinent information. Also discussed if pt had any family or friends here or belongings with them.   Pt has 5 lapsites w/ dermabond, CDI, no bleeding or hematoma noted. Bp is upper 90s to low 100s systolic, fluids are going at a moderate pace. Hr is 56-62. Pt is on 3L [place], O2 is 96-98%.   Fentanyl 100 mcg was given in Pacu w/ good effect. She is very sensitive to pain meds.   Blood glucose of 282 was treated with her floor sliding scale 8 units Aspart to RUA per Anesthesia request.   Pt's family was updated. Pt coming up on a stretcher.   Pt exits my care.

## 2023-04-13 NOTE — Transfer of Care (Signed)
Immediate Anesthesia Transfer of Care Note  Patient: Anne Mcintyre  Procedure(s) Performed: XI LEFT ROBOT-ASSISTED LAPAROSCOPIC ADRENALECTOMY (Left: Abdomen)  Patient Location: PACU  Anesthesia Type:General  Level of Consciousness: awake, drowsy, and patient cooperative  Airway & Oxygen Therapy: Patient Spontanous Breathing and Patient connected to face mask oxygen  Post-op Assessment: Report given to RN and Post -op Vital signs reviewed and stable  Post vital signs: Reviewed and stable  Last Vitals:  Vitals Value Taken Time  BP 120/74   Temp    Pulse 56   Resp 13   SpO2 100     Last Pain:  Vitals:   04/13/23 0613  TempSrc:   PainSc: 0-No pain         Complications: No notable events documented.

## 2023-04-13 NOTE — Interval H&P Note (Signed)
History and Physical Interval Note:  04/13/2023 6:51 AM  Anne Mcintyre  has presented today for surgery, with the diagnosis of LEFT ADRENAL MASS.  The various methods of treatment have been discussed with the patient and family. After consideration of risks, benefits and other options for treatment, the patient has consented to  Procedure(s): XI LEFT ROBOT-ASSISTED LAPAROSCOPIC ADRENALECTOMY (Left) as a surgical intervention.  The patient's history has been reviewed, patient examined, no change in status, stable for surgery.  I have reviewed the patient's chart and labs.  Questions were answered to the patient's satisfaction.     [employer]

## 2023-04-13 NOTE — Progress Notes (Signed)
RN attemped to ambulate at 1715 and 1830. Pt still very groggy from surgery. Will pass on to night shift RN.

## 2023-04-13 NOTE — Anesthesia Procedure Notes (Signed)
Procedure Name: Intubation Date/Time: 04/13/2023 7:31 AM  Performed by: Sindy Guadeloupe, CRNAPre-anesthesia Checklist: Patient identified, Emergency Drugs available, Suction available, Patient being monitored and Timeout performed Patient Re-evaluated:Patient Re-evaluated prior to induction Oxygen Delivery Method: Circle system utilized Preoxygenation: Pre-oxygenation with 100% oxygen Induction Type: IV induction Ventilation: Mask ventilation without difficulty Laryngoscope Size: Mac and 4 Grade View: Grade I Tube type: Oral Tube size: 7.0 mm Number of attempts: 1 Airway Equipment and Method: Stylet Placement Confirmation: ETT inserted through vocal cords under direct vision, positive ETCO2 and breath sounds checked- equal and bilateral Secured at: 22 cm Tube secured with: Tape Dental Injury: Teeth and Oropharynx as per pre-operative assessment

## 2023-04-13 NOTE — Anesthesia Postprocedure Evaluation (Signed)
Anesthesia Post Note  Patient: Anne Mcintyre  Procedure(s) Performed: XI LEFT ROBOT-ASSISTED LAPAROSCOPIC ADRENALECTOMY (Left: Abdomen)     Patient location during evaluation: PACU Anesthesia Type: General Level of consciousness: awake Pain management: pain level controlled Vital Signs Assessment: post-procedure vital signs reviewed and stable Respiratory status: spontaneous breathing, nonlabored ventilation and respiratory function stable Cardiovascular status: blood pressure returned to baseline and stable Postop Assessment: no apparent nausea or vomiting Anesthetic complications: no   No notable events documented.  Last Vitals:  Vitals:   04/13/23 1135 04/13/23 1140  BP:  (!) 105/56  Pulse: 62 (!) 55  Resp:    Temp:    SpO2: 97% 96%    Last Pain:  Vitals:   04/13/23 1140  TempSrc:   PainSc: Asleep                 Linton Rump

## 2023-04-13 NOTE — Op Note (Signed)
Preoperative diagnosis: Left adrenal adenoma, ACTH-independent Cushing's syndrome  Postoperative diagnosis: Left adrenal adenoma, ACTH- independent Cushing's syndrome  Procedure:  Left robotic-assisted adrenalectomy  Surgeon: Moody Bruins. M.D.  Assistant(s): Harrie Foreman, PA-C  An assistant was required for this surgical procedure.  The duties of the assistant included but were not limited to suctioning, passing suture, camera manipulation, retraction. This procedure would not be able to be performed without an Geophysicist/field seismologist.  Resident: Dr. Michaele Offer  Anesthesia: General  Complications: None  EBL: 40 mL  IVF:  1800 mL crystalloid  Specimens: Left adrenal mass  Disposition of specimens: Pathology  Indication:  Anne Mcintyre is a 57 y.o. year old patient with a left adrenal adenoma and ACTH-independent Cushing's syndrome.  After a thorough review of the management options for their adrenal mass, they elected to proceed with surgical treatment and the above procedure.  We have discussed the potential benefits and risks of the procedure, side effects of the proposed treatment, the likelihood of the patient achieving the goals of the procedure, and any potential problems that might occur during the procedure or recuperation. Informed consent has been obtained.   Description of procedure:  The patient was taken to the operating room and a general anesthetic was administered. The patient was given preoperative antibiotics, placed in the left modified flank position with care to pad all potential pressure points, and prepped and draped in the usual sterile fashion. Next a preoperative timeout was performed.  50 mg of hydrocortisone IV was administered preoperatively.  A site was selected in the upper midline for initial port placement. This was placed using a standard open Hassan technique which allowed entry into the peritoneal cavity under direct vision and without  difficulty. A 12 mm port was placed and a pneumoperitoneum established. The camera was then used to inspect the abdomen and there was no evidence of any intra-abdominal injuries or other abnormalities. The remaining abdominal ports were then placed. 8 mm robotic ports were placed in the left upper quadrant, left lower quadrant, and far left lateral abdominal wall. A 8 mm port was placed to the left of the midline just off the rectus muscle for the camera. All ports were placed under direct vision without difficulty. The surgical cart was then docked.   Utilizing the cautery scissors, the white line of Toldt was incised allowing the colon to be mobilized medially and the plane between the mesocolon and the anterior layer of Gerota's fascia to be developed and the kidney to be exposed.  The ureter and gonadal vein were identified inferiorly and the ureter was lifted anteriorly off the psoas muscle.  Dissection proceeded superiorly along the gonadal vein until the renal vein was identified.  The main renal vein was identified.  In addition, the main renal artery was identified just inferior to the renal vein.  As further dissection of the hilum proceeded, it was also apparent that there was in additional upper pole renal artery that extended just superior to the renal vein and then bifurcated toward the kidney.  Dissection then proceeded along the superior aspect of the renal vein and along the superior aspect of the upper pole renal artery.  The space between the kidney and the perinephric fat was developed down to the psoas muscle.  The renal vein was identified and was ligated with multiple Hem-o-lok clips and divided.  The attachments between the spleen and Gerota's fascia was divided allowing the superior plan to be developed down to the  psoas muscle as well.  The adrenal gland was then lifted off of the psoas muscle with retraction from the fourth arm.  Any small arteries were ligated with bipolar cautery.  The  posterior dissection was then completed and the lateral dissection was then completed by incising the lateral peritoneal attachments.  This allowed the adrenal gland to be removed intact within the surrounding perinephric fat.  This was placed into a retrieval bag.  Hemostasis was ensured.  The kidney was placed back into its anatomic position.  All ports removed under direct vision.  The specimen was removed intact via the assistant port site.  This fascial opening was then closed with 2 running 0 PDS sutures.  The skin was reapproximated with a 4-0 Monocryl subcuticular closure as were all other incision sites.  The patient tolerated the procedure well without complications.  She was able to be extubated and transferred to recovery unit in satisfactory condition.   Moody Bruins MD

## 2023-04-13 NOTE — Discharge Instructions (Signed)
Activity:  You are encouraged to ambulate frequently (about every hour during waking hours) to help prevent blood clots from forming in your legs or lungs.  However, you should not engage in any heavy lifting (> 10-15 lbs), strenuous activity, or straining. Diet: You should advance your diet as instructed by your physician.  It will be normal to have some bloating, nausea, and abdominal discomfort intermittently. Prescriptions:  You will be provided a prescription for pain medication to take as needed.  If your pain is not severe enough to require the prescription pain medication, you may take extra strength Tylenol instead which will have less side effects.  You should also take a prescribed stool softener to avoid straining with bowel movements as the prescription pain medication may constipate you. Incisions: You may remove your dressing bandages 48 hours after surgery if not removed in the hospital.  You will either have some small staples or special tissue glue at each of the incision sites. Once the bandages are removed (if present), the incisions may stay open to air.  You may start showering (but not soaking or bathing in water) the 2nd day after surgery and the incisions simply need to be patted dry after the shower.  No additional care is needed. What to call us about: You should call the office [phone]) if you develop fever > 101 or develop persistent vomiting.   You may resume advil, aleve, vitamins, and supplements 7 days after surgery.   Hydrocortisone dosing: 20 mg evening of 04/14/23 20 mg morning of 04/15/23 then 10 mg that night 10 mg morning and evening of 04/16/23 10 mg daily after that until seen by Dr. Sharl Ma

## 2023-04-13 NOTE — Progress Notes (Signed)
Patient ID: Anne Mcintyre, female   DOB: 07-23-1966, 57 y.o.   MRN: [id number]  Post-op note  Subjective: The patient is doing well.  No complaints.  Objective: Vital signs in last 24 hours: Temp:  [97.3 F (36.3 C)-97.9 F (36.6 C)] 97.3 F (36.3 C) (06/03 1205) Pulse Rate:  [55-65] 58 (06/03 1205) Resp:  [7-16] 14 (06/03 1205) BP: (78-123)/(36-74) 116/65 (06/03 1205) SpO2:  [95 %-100 %] 100 % (06/03 1205) Weight:  [95 kg] 95 kg (06/03 0613)  Intake/Output from previous day: No intake/output data recorded. Intake/Output this shift: Total I/O In: 2127.4 [P.O.:120; I.V.:1907.4; IV Piggyback:100] Out: 215 [Urine:175; Blood:40]  Physical Exam:  General: Alert and oriented. Abdomen: Soft, Nondistended. Incisions: Clean and dry.  Lab Results: Recent Labs    04/13/23 0539 04/13/23 1204  HGB 13.8 14.0  HCT 42.2 42.8    Assessment/Plan: POD#0   1) Continue to monitor, ambulate, IS, continue steroid replacement with hydrocortisone and begin taper   Moody Bruins. MD   LOS: 0 days   Crecencio Mc 04/13/2023, 5:06 PM

## 2023-04-14 ENCOUNTER — Encounter (HOSPITAL_COMMUNITY): Payer: Self-pay | Admitting: Urology

## 2023-04-14 LAB — HEMOGLOBIN A1C
Hgb A1c MFr Bld: 9.6 % — ABNORMAL HIGH (ref 4.8–5.6)
Mean Plasma Glucose: 229 mg/dL

## 2023-04-14 LAB — BASIC METABOLIC PANEL
Anion gap: 6 (ref 5–15)
BUN: 12 mg/dL (ref 6–20)
CO2: 23 mmol/L (ref 22–32)
Calcium: 8.3 mg/dL — ABNORMAL LOW (ref 8.9–10.3)
Chloride: 105 mmol/L (ref 98–111)
Creatinine, Ser: 0.94 mg/dL (ref 0.44–1.00)
GFR, Estimated: >60 mL/min (ref >60)
Glucose, Bld: 189 mg/dL — ABNORMAL HIGH (ref 70–99)
Potassium: 4.1 mmol/L (ref 3.5–5.1)
Sodium: 134 mmol/L — ABNORMAL LOW (ref 135–145)

## 2023-04-14 LAB — GLUCOSE, CAPILLARY
Glucose-Capillary: 169 mg/dL — ABNORMAL HIGH (ref 70–99)
Glucose-Capillary: 232 mg/dL — ABNORMAL HIGH (ref 70–99)
Glucose-Capillary: 242 mg/dL — ABNORMAL HIGH (ref 70–99)
Glucose-Capillary: 166 mg/dL — ABNORMAL HIGH (ref 70–99)
Glucose-Capillary: 266 mg/dL — ABNORMAL HIGH (ref 70–99)

## 2023-04-14 LAB — HEMOGLOBIN AND HEMATOCRIT, BLOOD
HCT: 40.1 % (ref 36.0–46.0)
Hemoglobin: 13.1 g/dL (ref 12.0–15.0)

## 2023-04-14 MED ORDER — KETOROLAC TROMETHAMINE 10 MG PO TABS: 10.0000 mg | ORAL_TABLET | Freq: Three times a day (TID) | ORAL | 0 refills | Status: AC

## 2023-04-14 MED ORDER — KETOROLAC TROMETHAMINE 10 MG PO TABS
10.0000 mg | ORAL_TABLET | Freq: Three times a day (TID) | ORAL | Status: DC
  Administered 2023-04-14: 10 mg via ORAL
  Filled 2023-04-14: qty 1

## 2023-04-14 MED ORDER — BISACODYL 10 MG RE SUPP: 10.0000 mg | Freq: Once | RECTAL | Status: DC

## 2023-04-14 MED ORDER — ASPIRIN 81 MG PO TBEC: 81.0000 mg | DELAYED_RELEASE_TABLET | Freq: Every day | ORAL | 12 refills | Status: AC

## 2023-04-14 NOTE — Progress Notes (Signed)
Patient ID: Anne Mcintyre, female   DOB: 25-Mar-1966, 57 y.o.   MRN: [id number]  1 Day Post-Op Subjective: Doing very well.  Ambulated last night.  Has required no narcotic medication.  Ketorolac has been administered IV.  No nause or vomiting.  Hemodynamically stable.  Objective: Vital signs in last 24 hours: Temp:  [97.3 F (36.3 C)-98.3 F (36.8 C)] 98.1 F (36.7 C) (06/04 0423) Pulse Rate:  [54-69] 69 (06/04 0423) Resp:  [7-18] 18 (06/04 0423) BP: (78-120)/(36-74) 113/59 (06/04 0423) SpO2:  [95 %-100 %] 98 % (06/04 0423)  Intake/Output from previous day: 06/03 0701 - 06/04 0700 In: 4052.1 [P.O.:1060; I.V.:2792.1; IV Piggyback:200] Out: 2915 [Urine:2875; Blood:40] Intake/Output this shift: No intake/output data recorded.  Physical Exam:  General: Alert and oriented CV: RRR Lungs: Clear Abdomen: Soft, ND, positive BS Incisions: C/D/I Ext: NT, No erythema  Lab Results: Recent Labs    04/13/23 0539 04/13/23 1204 04/14/23 0512  HGB 13.8 14.0 13.1  HCT 42.2 42.8 40.1   BMET Recent Labs    04/13/23 1204 04/14/23 0512  NA 134* 134*  K 3.9 4.1  CL 102 105  CO2 21* 23  GLUCOSE 278* 189*  BUN 20 12  CREATININE 0.98 0.94  CALCIUM 8.7* 8.3*     Studies/Results: No results found.  Assessment/Plan: POD # 1 s/p left RAL adrenalectomy - SL IVF, ambulate - Advance diet - D/C Foley - Transition to oral ketorolac - Will begin oral steroid taper with plans to gradually taper down to 10 mg hydrocortisone daily until she sees Dr. Sharl Ma in a couple of weeks. - Plan for discharge later this morning   LOS: 1 day   Crecencio Mc 04/14/2023, 7:36 AM

## 2023-04-14 NOTE — Plan of Care (Signed)
  Problem: Nutritional: Goal: Maintenance of adequate nutrition will improve Outcome: Progressing Goal: Progress toward achieving an optimal weight will improve Outcome: Progressing   Problem: Skin Integrity: Goal: Risk for impaired skin integrity will decrease Outcome: Progressing   Problem: Tissue Perfusion: Goal: Adequacy of tissue perfusion will improve Outcome: Progressing

## 2023-04-14 NOTE — TOC Initial Note (Signed)
Transition of Care [facility]) - Initial/Assessment Note    Patient Details  Name: Anne Mcintyre MRN: [id number] Date of Birth: 01-16-1966  [facility]) CM/SW Contact:    Howell Rucks, RN Phone Number: 04/14/2023, 10:30 AM  Clinical Narrative: Met with pt at bedside to introduce role of TOC/NCM and review for dc planning, pt admitted 04/13/23 for scheduled procedure. Pt confirms she has PCP, pharmacy and transport in place for discharge, reports no home DME or [facility] services prior to admission. Pt voiced no questions/concerns at this time. TOC will continue to follow.                    Expected Discharge Plan: Home/Self Care Barriers to Discharge: Continued Medical Work up   Patient Goals and CMS Choice Patient states their goals for this hospitalization and ongoing recovery are:: return home          Expected Discharge Plan and Services       Living arrangements for the past 2 months: Single Family Home                                      Prior Living Arrangements/Services Living arrangements for the past 2 months: Single Family Home Lives with:: Significant Other Patient language and need for interpreter reviewed:: Yes Do you feel safe going back to the place where you live?: Yes      Need for Family Participation in Patient Care: Yes (Comment) Care giver support system in place?: Yes (comment)   Criminal Activity/Legal Involvement Pertinent to Current Situation/Hospitalization: No - Comment as needed  Activities of Daily Living Home Assistive Devices/Equipment: CBG Meter, Blood pressure cuff, Eyeglasses ADL Screening (condition at time of admission) Patient's cognitive ability adequate to safely complete daily activities?: Yes Is the patient deaf or have difficulty hearing?: No Does the patient have difficulty seeing, even when wearing glasses/contacts?: No Does the patient have difficulty concentrating, remembering, or making decisions?: No Patient  able to express need for assistance with ADLs?: Yes Does the patient have difficulty dressing or bathing?: No Independently performs ADLs?: Yes (appropriate for developmental age) Does the patient have difficulty walking or climbing stairs?: No Weakness of Legs: None Weakness of Arms/Hands: None  Permission Sought/Granted Permission sought to share information with : Case Manager Permission granted to share information with : Yes, Verbal Permission Granted  Share Information with NAME: Fannie Knee, RN           Emotional Assessment Appearance:: Appears stated age Attitude/Demeanor/Rapport: Gracious Affect (typically observed): Accepting Orientation: : Oriented to Self, Oriented to Place, Oriented to  Time, Oriented to Situation Alcohol / Substance Use: Not Applicable Psych Involvement: No (comment)  Admission diagnosis:  Left adrenal mass [facility]) [E27.8] Patient Active Problem List   Diagnosis Date Noted   Left adrenal mass (HCC) 04/13/2023   Leg cramps 10/28/2011   Syncope and collapse    Hypertension    Hyperthyroidism    Atrial fibrillation (HCC)    PCP:  [facility] Pharmacy:   [id number] [place] Evangel[place],  - [address] [place] [address] Phone: [phone] Fax: [phone]     Social Determinants of Health (SDOH) Social History: SDOH Screenings   Food Insecurity: No Food Insecurity (04/13/2023)  Housing: Low Risk  (04/13/2023)  Transportation Needs: No Transportation Needs (04/13/2023)  Utilities: Not At Risk (04/13/2023)  Tobacco Use: Medium Risk (04/14/2023)   SDOH Interventions:     Readmission Risk Interventions    04/14/2023   10:29 AM  Readmission Risk Prevention Plan  Post Dischage Appt Complete  Medication Screening Complete  Transportation Screening Complete

## 2023-04-15 LAB — SURGICAL PATHOLOGY

## 2023-04-15 NOTE — Discharge Summary (Signed)
Date of admission: 04/13/2023  Date of discharge: 04/14/23  Admission diagnosis: Left adrenal mass  Discharge diagnosis: same  Secondary diagnoses: IBS, Graves disease, Afib, anxiety, cushings, DM II, HLD, HTN, Hypothyroidism  History and Physical: For full details, please see admission history and physical. Briefly, Anne Mcintyre is a 57 y.o. year old patient with a left adrenal adenoma and suspected ACTH-independent Cushing's syndrome. Her medical history is significant for diabetes, hyperlipidemia, migraine headaches, hypertension, anxiety, atrial fibrillation, and IBS. She has a history of Grave's disease and is on thyroid replacement s/p ablative treatment. Overall, she has remained in relatively stable health. She has quit smoking as of a year ago. She has had some increased difficulties managing her blood glucose. She has also gained approximately 15 pounds. She denies any new cardiac or pulmonary issues. She has not had abdominal surgery since her last visit with me.    She has had evidence of Cushing's syndrome on biochemical evaluation with mild elevation of serum cortisol at 21.2 ug/dl and low ACTH at 6.9 pg/ml on 03/04/21. She has symptoms including hyperglycemia, hypertension, and obesity. A low dose dexamethasone test was performed in March 2022 with suppression but incomplete suppression of cortisol (2.5 ug/dl). She has a known 3.2 cm left adrenal adenoma. This was most recently imaged on 04/18/21 with a non-contrast CT of the abdomen indicating a stable left adrenal mass consistent with adenoma by imaging criteria. Marland Kitchen   Hospital Course: Pt was admitted and taken to the OR on 04/13/23 for a RAL left adrenalectomy.  Pt tolerated the procedure well and was hemodynamically stable throughout.  He was extubated without complication and woke up from anesthesia neurologically intact.  Pt was transferred from the OR to PACU and then to the floor without difficulty.  Post op course progressed as expected.   She was maintained on hydrocortisone throughout the hospital course. On POD 1 pt was able to ambulate and void after foley removal.  Her diet was advanced and she maintained good pain control. All labs and vitals were stable. She was felt stable for d/c home on the afternoon of POD 1.  Laboratory values:  Recent Labs    04/13/23 0539 04/13/23 1204 04/14/23 0512  HGB 13.8 14.0 13.1  HCT 42.2 42.8 40.1   Recent Labs    04/13/23 1204 04/14/23 0512  CREATININE 0.98 0.94    Disposition: Home  Discharge instruction: The patient was instructed to be ambulatory but told to refrain from heavy lifting, strenuous activity, or driving. Taper instructions for hydrocortisone were provided.  Discharge medications:  Allergies as of 04/14/2023       Reactions   Codeine Hives   Demerol Hives   Dilaudid [hydromorphone] Nausea And Vomiting   Methimazole Hives   Morphine Nausea And Vomiting   Penicillins Hives   Over 40 years since "reaction" hives. No throat swelling or syncope.         Medication List     STOP taking these medications    Calcium 600 + Minerals 600-200 MG-UNIT Tabs   cholecalciferol 25 MCG (1000 UNIT) tablet Commonly known as: VITAMIN D3   Flaxseed Oil 1000 MG Caps   ibuprofen 200 MG tablet Commonly known as: ADVIL   vitamin C 1000 MG tablet   zinc gluconate 50 MG tablet       TAKE these medications    aspirin EC 81 MG tablet Take 1 tablet (81 mg total) by mouth daily. Swallow whole.   atorvastatin 10 MG tablet  Commonly known as: LIPITOR Take 10 mg by mouth daily.   diphenhydrAMINE 25 MG tablet Commonly known as: BENADRYL Take 25 mg by mouth at bedtime as needed for itching.   docusate sodium 100 MG capsule Commonly known as: COLACE Take 1 capsule (100 mg total) by mouth 2 (two) times daily.   Farxiga 10 MG Tabs tablet Generic drug: dapagliflozin propanediol Take 10 mg by mouth daily.   hydrocortisone 10 MG tablet Commonly known as:  CORTEF Take 10 mg by mouth daily.   ketorolac 10 MG tablet Commonly known as: TORADOL Take 1 tablet (10 mg total) by mouth every 8 (eight) hours.   levothyroxine 125 MCG tablet Commonly known as: SYNTHROID Take 125 mcg by mouth daily before breakfast.   Linzess 72 MCG capsule Generic drug: linaclotide Take 72 mcg by mouth daily before breakfast.   lisinopril-hydrochlorothiazide 10-12.5 MG tablet Commonly known as: ZESTORETIC Take 1 tablet by mouth daily.   loratadine 10 MG tablet Commonly known as: CLARITIN Take 10 mg by mouth daily as needed for allergies.   LORazepam 0.5 MG tablet Commonly known as: ATIVAN Take 0.25 mg by mouth daily as needed for anxiety.   metoprolol tartrate 50 MG tablet Commonly known as: LOPRESSOR Take 50 mg by mouth daily.   NovoLOG FlexPen 100 UNIT/ML FlexPen Generic drug: insulin aspart Inject 20 Units into the skin 3 (three) times daily with meals.   Ozempic (0.25 or 0.5 MG/DOSE) 2 MG/3ML Sopn Generic drug: Semaglutide(0.25 or 0.5MG /DOS) Inject 0.25 mg into the skin once a week.   Relpax 40 MG tablet Generic drug: eletriptan Take 40 mg by mouth as needed for migraine. may repeat in 2 hours if necessary   Rhinocort Allergy 32 MCG/ACT nasal spray Generic drug: budesonide Place 1 spray into both nostrils daily as needed for rhinitis.        Followup:   Follow-up Information     Heloise Purpura, MD Follow up on 05/05/2023.   Specialty: Urology Why: at 9:15 Contact information: [address] [place] [address] [phone]

## 2023-04-18 ENCOUNTER — Other Ambulatory Visit: Payer: Self-pay

## 2024-08-03 ENCOUNTER — Other Ambulatory Visit: Payer: Self-pay | Admitting: Nephrology

## 2024-08-03 DIAGNOSIS — N1832 Chronic kidney disease, stage 3b: Secondary | ICD-10-CM

## 2024-08-18 ENCOUNTER — Ambulatory Visit
Admission: RE | Admit: 2024-08-18 | Discharge: 2024-08-18 | Disposition: A | Discharge status: Home / Self Care | Source: Ambulatory Visit | Attending: Nephrology | Admitting: Nephrology

## 2024-08-18 DIAGNOSIS — N1832 Chronic kidney disease, stage 3b: Secondary | ICD-10-CM
